# Patient Record
Sex: Female | Born: 1985 | Race: White | Hispanic: No | Marital: Married | Smoking: Never smoker
Health system: Southern US, Community
[De-identification: ages and names within clinical notes are randomized; demographics above are authoritative.]

## PROBLEM LIST (undated history)

## (undated) DIAGNOSIS — J45909 Unspecified asthma, uncomplicated: Secondary | ICD-10-CM

## (undated) DIAGNOSIS — T7840XA Allergy, unspecified, initial encounter: Secondary | ICD-10-CM

## (undated) DIAGNOSIS — C8190 Hodgkin lymphoma, unspecified, unspecified site: Secondary | ICD-10-CM

## (undated) HISTORY — DX: Allergy, unspecified, initial encounter: T78.40XA

## (undated) HISTORY — DX: Hodgkin lymphoma, unspecified, unspecified site: C81.90

## (undated) HISTORY — DX: Unspecified asthma, uncomplicated: J45.909

---
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
## 2007-07-25 HISTORY — PX: PORTA CATH INSERTION: CATH118285

## 2007-07-25 HISTORY — PX: OTHER SURGICAL HISTORY: SHX169

## 2008-07-24 HISTORY — PX: PORT-A-CATH REMOVAL: SHX5289

## 2017-01-02 ENCOUNTER — Encounter: Payer: Self-pay | Admitting: Hematology

## 2017-01-02 ENCOUNTER — Telehealth: Payer: Self-pay | Admitting: Hematology

## 2017-01-02 NOTE — Telephone Encounter (Signed)
Appt has been scheduled for the pt to see Dr. Irene Limbo on 7/19 at 11am. Pt aware to arrive 30 minutes early. Pt address and insurance has been verified. Letter mailed to the pt and faxed to the referring.

## 2017-01-12 ENCOUNTER — Telehealth: Payer: Self-pay | Admitting: Hematology

## 2017-01-12 NOTE — Telephone Encounter (Signed)
Returned call re rescheduling appointment.  Moved from 7/19 to 7/25 @ 3 pm to arrive 2:30 pm. Patient has new date/time.

## 2017-02-08 ENCOUNTER — Encounter: Payer: Commercial Managed Care - PPO | Admitting: Hematology

## 2017-02-14 ENCOUNTER — Telehealth: Payer: Self-pay | Admitting: Hematology

## 2017-02-14 ENCOUNTER — Ambulatory Visit (HOSPITAL_BASED_OUTPATIENT_CLINIC_OR_DEPARTMENT_OTHER): Payer: Commercial Managed Care - PPO | Admitting: Hematology

## 2017-02-14 ENCOUNTER — Encounter: Payer: Self-pay | Admitting: Hematology

## 2017-02-14 VITALS — BP 122/72 | HR 91 | Temp 98.4°F | Resp 20 | Ht 71.0 in | Wt 155.7 lb

## 2017-02-14 DIAGNOSIS — C8110 Nodular sclerosis classical Hodgkin lymphoma, unspecified site: Secondary | ICD-10-CM

## 2017-02-14 DIAGNOSIS — Z8571 Personal history of Hodgkin lymphoma: Secondary | ICD-10-CM

## 2017-02-14 DIAGNOSIS — Z803 Family history of malignant neoplasm of breast: Secondary | ICD-10-CM | POA: Diagnosis not present

## 2017-02-14 DIAGNOSIS — J45990 Exercise induced bronchospasm: Secondary | ICD-10-CM | POA: Diagnosis not present

## 2017-02-14 NOTE — Progress Notes (Signed)
Marland Kitchen    HEMATOLOGY/ONCOLOGY CONSULTATION NOTE  Date of Service: 02/14/2017  PCP: Starlyn Skeans PA-C  CHIEF COMPLAINTS/PURPOSE OF CONSULTATION:  Transfer of care for Hodgkins Lymphoma  HISTORY OF PRESENTING ILLNESS:  Sydney Cannon is a wonderful 31 y.o. female who has been referred to [place] by Starlyn Skeans PA-C for evaluation and continued management of Hodgkins Lymphoma.  Patient was previously treated for her Hodgkin's lymphoma at [facility] in [place]. 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 She had presented with neck lymphadenopathy with no constitutional symptoms. PET/CT scan done on 05/01/2008 as per report showed bulky lymphadenopathy within the bilateral inferior neck and supraclavicular areas and an abnormality along the liver which was noted to be small. She was staged as stage IIIA. She completed treatment with ABVD 6 cycles from 05/20/2008 through 10/19/2008. Patient did not receive any radiation. Patient was noted to have complete remission. She has remained in complete remission since March 2010 for more than 8 years.  Currently the patient notes no significant new lymphadenopathy. No fevers no chills no night sweats. No new fatigue. No chest pain or shortness of breath. No abdominal pain or distention. No residual toxicities from her prior chemotherapy.   Patient did conceive 2 years ago and has a 44-year-old child. She and her husband are currently trying to get pregnant. She is on Femara and progesterone to help with trying to get pregnant again.   MEDICAL HISTORY:  Past Medical History:  Diagnosis Date  . Allergy    Seasonal allergies  . Asthma   . Hodgkin's lymphoma ([place])   History of right breast mass unchanged in the past several years patient notes this was noted to be fibrocystic change. Multiple allergies including decreased grasses and cats was previously on desensitization  therapy. Mitral valve prolapse Chronic low back pain History of stomach ulcer in 1997 Exercise-induced asthma   SURGICAL HISTORY: Past Surgical History:  Procedure Laterality Date  . biopsy Right 2009   lymphnode on neck  . PORT-A-CATH REMOVAL Left 2010  . PORTA CATH INSERTION Left 2009  . WISDOM TOOTH EXTRACTION  2006    SOCIAL HISTORY: Social History   Social History  . Marital status: Unknown    Spouse name: N/A  . Number of children: N/A  . Years of education: N/A   Occupational History  . Not on file.   Social History Main Topics  . Smoking status: Never Smoker  . Smokeless tobacco: Never Used  . Alcohol use Yes     Comment: Occasional  . Drug use: No  . Sexual activity: Yes     Comment: Trying to get pregnant   Other Topics Concern  . Not on file   Social History Narrative  . 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   FAMILY HISTORY: Family History  Problem Relation Age of Onset  . Fibromyalgia Mother   . Cancer Maternal Aunt        Breast - cause of death  . Cancer Maternal Grandfather        Lingual   . Stroke Paternal Grandfather   Maternal aunt had breast cancer in her early 68s Maternal grandfather tongue cancer. Was a smoker.  ALLERGIES:  has no allergies on file.  MEDICATIONS:  Current Outpatient Prescriptions  Medication Sig Dispense Refill  .  cetirizine (ZYRTEC) 10 MG tablet Take 10 mg by mouth daily.    Marland Kitchen letrozole (FEMARA) 2.5 MG tablet Take 2.5 mg by mouth daily. Take 5mg  days 3-7 of cycle.    . Prenatal Vit-Fe Fumarate-FA (PRENATAL MULTIVITAMIN) TABS tablet Take 1 tablet by mouth daily at 12 noon.    . progesterone (PROMETRIUM) 100 MG capsule Take 100 mg by mouth daily. Take days 15-30 of cycle.     No current facility-administered medications for this visit.     REVIEW OF SYSTEMS:    10  Point review of Systems was done is negative except as noted above.  PHYSICAL EXAMINATION: ECOG PERFORMANCE STATUS: 0 - Asymptomatic  . Vitals:   02/14/17 1448  BP: 122/72  Pulse: 91  Resp: 20  Temp: 98.4 F (36.9 C)   Filed Weights   02/14/17 1448  Weight: 155 lb 11.2 oz (70.6 kg)   .Body mass index is 21.72 kg/m.  GENERAL:alert, in no acute distress and comfortable SKIN: no acute rashes, no significant lesions EYES: conjunctiva are pink and non-injected, sclera anicteric OROPHARYNX: MMM, no exudates, no oropharyngeal erythema or ulceration NECK: supple, no JVD LYMPH:  no palpable lymphadenopathy in the cervical, axillary or inguinal regions LUNGS: clear to auscultation b/l with normal respiratory effort HEART: regular rate & rhythm ABDOMEN:  normoactive bowel sounds , non tender, not distended. Extremity: no pedal edema PSYCH: alert & oriented x 3 with fluent speech NEURO: no focal motor/sensory deficits  LABORATORY DATA:  I have reviewed the data as listed  .No flowsheet data found.  .No flowsheet data found.   RADIOGRAPHIC STUDIES: I have personally reviewed the radiological images as listed and agreed with the findings in the report. No results found.  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 She had presented with neck lymphadenopathy with no constitutional symptoms. PET/CT scan done on 05/01/2008 as per report showed bulky lymphadenopathy within the bilateral inferior neck and supraclavicular areas and an abnormality along the liver which was noted to be small. She was staged as stage IIIA. She completed treatment with ABVD 6 cycles from 05/20/2008 through 10/19/2008. Patient did not receive any radiation. Patient was noted to have complete  remission. She has remained in complete remission since March 2010 for more than 8 years.  Plan -Outside records from [place] and primary care physician reviewed with the patient in details. -We discussed in detail NCCN survivorship and followup guidelines. -Patient currently has no clinical symptoms or physical findings suggestive of Hodgkin's lymphoma recurrence at this time. -No overt evidence of residual prohibitive toxicities from her previous chemotherapy. -No new chest pain or shortness of breath or suggestive cardiomyopathy or lung changes. -She has some chronic exercise-induced asthma which is very mild. -We discussed the options of getting repeat PFTs which the patient wants to hold off on at this time. -Baseline labs including CBC with differential and reticulocyte count, CMP and sedimentation rate have been ordered and are currently pending. -No indication for repeat imaging at this time in the absence of new symptoms and as that are any new concerning lab findings. -Repeat echo if symptomatic . Consideration of stress echo 10 years out from treatment.  #2 family history of early breast cancer in her maternal aunt in her early 41s.  Unclear if any genetic testing  was done. No family history of ovarian cancer, colon cancer, melanomas, prostate cancer or other cancers. Plan -I discussed and offered referral to the genetic counselor to discuss possible genetic testing for familial breast cancer. -Patient wants to hold off on genetic testing at this time.  #3 .Assisted reproductive techniques - on femara and progesterone at this time -- f/u with Gyn #4 Exercise induced asthma - mx per PCP  Labs tomorrow RTC with Dr Irene Limbo with labs in 12 months with labs. Earlier if any new concerns  All of the patients questions were answered with apparent satisfaction. The patient knows to call the clinic with any problems, questions or concerns.  I spent 45 minutes counseling the  patient face to face. The total time spent in the appointment was 60 minutes and more than 50% was on counseling and direct patient cares.    Sullivan Lone MD [place] AAHIVMS [facility] Hematology/Oncology Physician [facility]  (Office):       [phone] (Work cell):  [phone] (Fax):           [phone]  02/14/2017 3:39 PM

## 2017-02-14 NOTE — Telephone Encounter (Signed)
Patient cannot return to lab on tomorrow due to work schedule and scheduled lab for 8/3. Gave patient avs report and appointments for August 2018 and July 2019.

## 2017-02-23 ENCOUNTER — Other Ambulatory Visit: Payer: Commercial Managed Care - PPO

## 2017-02-23 ENCOUNTER — Ambulatory Visit (HOSPITAL_BASED_OUTPATIENT_CLINIC_OR_DEPARTMENT_OTHER): Payer: Commercial Managed Care - PPO

## 2017-02-23 DIAGNOSIS — Z8571 Personal history of Hodgkin lymphoma: Secondary | ICD-10-CM

## 2017-02-23 DIAGNOSIS — C8110 Nodular sclerosis classical Hodgkin lymphoma, unspecified site: Secondary | ICD-10-CM

## 2017-02-23 LAB — COMPREHENSIVE METABOLIC PANEL
ALBUMIN: 4.5 g/dL (ref 3.5–5.0)
ALT: 19 U/L (ref 0–55)
AST: 21 U/L (ref 5–34)
Alkaline Phosphatase: 18 U/L — ABNORMAL LOW (ref 40–150)
Anion Gap: 8 mEq/L (ref 3–11)
BUN: 12.6 mg/dL (ref 7.0–26.0)
CHLORIDE: 108 meq/L (ref 98–109)
CO2: 24 mEq/L (ref 22–29)
Calcium: 10 mg/dL (ref 8.4–10.4)
Creatinine: 0.9 mg/dL (ref 0.6–1.1)
EGFR: 86 mL/min/{1.73_m2} — ABNORMAL LOW (ref >90)
GLUCOSE: 85 mg/dL (ref 70–140)
POTASSIUM: 4.4 meq/L (ref 3.5–5.1)
SODIUM: 140 meq/L (ref 136–145)
Total Bilirubin: 0.8 mg/dL (ref 0.20–1.20)
Total Protein: 7.7 g/dL (ref 6.4–8.3)

## 2017-02-23 LAB — CBC & DIFF AND RETIC
BASO%: 0.7 % (ref 0.0–2.0)
BASOS ABS: 0.1 10*3/uL (ref 0.0–0.1)
EOS ABS: 0.2 10*3/uL (ref 0.0–0.5)
EOS%: 2.1 % (ref 0.0–7.0)
HCT: 39.1 % (ref 34.8–46.6)
HEMOGLOBIN: 12.7 g/dL (ref 11.6–15.9)
Immature Retic Fract: 0.2 % — ABNORMAL LOW (ref 1.60–10.00)
LYMPH#: 2.7 10*3/uL (ref 0.9–3.3)
LYMPH%: 38.4 % (ref 14.0–49.7)
MCH: 27.1 pg (ref 25.1–34.0)
MCHC: 32.5 g/dL (ref 31.5–36.0)
MCV: 83.4 fL (ref 79.5–101.0)
MONO#: 0.6 10*3/uL (ref 0.1–0.9)
MONO%: 8.6 % (ref 0.0–14.0)
NEUT#: 3.6 10*3/uL (ref 1.5–6.5)
NEUT%: 50.2 % (ref 38.4–76.8)
NRBC: 0 % (ref 0–0)
Platelets: 238 10*3/uL (ref 145–400)
RBC: 4.69 10*6/uL (ref 3.70–5.45)
RDW: 13.5 % (ref 11.2–14.5)
RETIC %: 1.05 % (ref 0.70–2.10)
Retic Ct Abs: 49.25 10*3/uL (ref 33.70–90.70)
WBC: 7.1 10*3/uL (ref 3.9–10.3)

## 2017-02-23 LAB — TSH: TSH: 1.538 m(IU)/L (ref 0.308–3.960)

## 2017-02-24 LAB — T3 UPTAKE
Free Thyroxine Index: 3.3 (ref 1.2–4.9)
T3 Uptake Ratio: 53 % — ABNORMAL HIGH (ref 24–39)

## 2017-02-24 LAB — SEDIMENTATION RATE: SED RATE: 2 mm/h (ref 0–32)

## 2017-02-24 LAB — T4: Thyroxine (T4): 6.2 ug/dL (ref 4.5–12.0)

## 2017-02-24 LAB — T4, FREE: FREE T4: 1.08 ng/dL (ref 0.82–1.77)

## 2017-05-21 ENCOUNTER — Other Ambulatory Visit (HOSPITAL_BASED_OUTPATIENT_CLINIC_OR_DEPARTMENT_OTHER): Payer: Self-pay | Admitting: Internal Medicine

## 2017-05-21 ENCOUNTER — Ambulatory Visit (HOSPITAL_BASED_OUTPATIENT_CLINIC_OR_DEPARTMENT_OTHER)
Admission: RE | Admit: 2017-05-21 | Discharge: 2017-05-21 | Disposition: A | Discharge status: Home / Self Care | Payer: Commercial Managed Care - PPO | Source: Ambulatory Visit | Attending: Internal Medicine | Admitting: Internal Medicine

## 2017-05-21 DIAGNOSIS — R591 Generalized enlarged lymph nodes: Secondary | ICD-10-CM | POA: Insufficient documentation

## 2017-05-25 ENCOUNTER — Other Ambulatory Visit: Payer: Self-pay | Admitting: Allergy

## 2017-05-25 ENCOUNTER — Ambulatory Visit
Admission: RE | Admit: 2017-05-25 | Discharge: 2017-05-25 | Disposition: A | Discharge status: Home / Self Care | Payer: Commercial Managed Care - PPO | Source: Ambulatory Visit | Attending: Allergy | Admitting: Allergy

## 2017-05-25 DIAGNOSIS — J4520 Mild intermittent asthma, uncomplicated: Secondary | ICD-10-CM

## 2017-06-29 MED FILL — OVIDREL 250 MCG/0.5 ML SYRG: 250 | 1 days supply | Qty: 1 | Fill #0

## 2018-01-08 LAB — OB RESULTS CONSOLE HIV ANTIBODY (ROUTINE TESTING): HIV: NONREACTIVE

## 2018-01-08 LAB — OB RESULTS CONSOLE GC/CHLAMYDIA
Chlamydia: NEGATIVE
Gonorrhea: NEGATIVE

## 2018-01-08 LAB — OB RESULTS CONSOLE RUBELLA ANTIBODY, IGM: Rubella: IMMUNE

## 2018-02-05 NOTE — Progress Notes (Signed)
Marland Kitchen    HEMATOLOGY/ONCOLOGY CLINIC NOTE  Date of Service: 02/13/2018  PCP: Starlyn Skeans PA-C  CHIEF COMPLAINTS/PURPOSE OF CONSULTATION:  Transfer of care for Hodgkins Lymphoma  HISTORY OF PRESENTING ILLNESS:  Sydney Cannon is a wonderful 32 y.o. female who has been referred to [place] by Starlyn Skeans PA-C for evaluation and continued management of Hodgkins Lymphoma.  Patient was previously treated for her Hodgkin's lymphoma at [facility] in [place]. 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 She had presented with neck lymphadenopathy with no constitutional symptoms. PET/CT scan done on 05/01/2008 as per report showed bulky lymphadenopathy within the bilateral inferior neck and supraclavicular areas and an abnormality along the liver which was noted to be small. She was staged as stage IIIA. She completed treatment with ABVD 6 cycles from 05/20/2008 through 10/19/2008. Patient did not receive any radiation. Patient was noted to have complete remission. She has remained in complete remission since March 2010 for more than 8 years.  Currently the patient notes no significant new lymphadenopathy. No fevers no chills no night sweats. No new fatigue. No chest pain or shortness of breath. No abdominal pain or distention. No residual toxicities from her prior chemotherapy.   Patient did conceive 2 years ago and has a 73-year-old child. She and her husband are currently trying to get pregnant. She is on Femara and progesterone to help with trying to get pregnant again.  Interval History:  Sydney Cannon returns today for management and evaluation of her Classical Hodgkin's Lymphoma. The patient's last visit with [place] was on 02/14/17. The pt reports that she is doing well overall.   The pt reports that she has become pregnant and is currently at [redacted] weeks gestation. She denies being SOB or palpitations. She has  been taking prenatal vitamins and has not taken any iron supplements. She is maintaining follow up with her OBGYN and has not had nausea thus far.   She notes that she has continued to monitor herself for enlarged lymph nodes. She notes stability in her right neck, slightly palpable, lymph node which was previously evaluated with an US Soft tissue neck on 05/22/17.   Lab results today (02/13/18) of CBC w/diff, CMP, and Reticulocytes is as follows: all values are WNL except for ANC at 6.8k, AST at 14, Alk Phos at 23. Sed rate 02/13/18 is WNL at 16  On review of systems, pt reports some fatigue that she associates with pregnancy, and denies SOB, palpitations, changes in breathing, abdominal pains, nausea, fevers, chills, night sweats, skin rashes, leg swelling, and any other symptoms.    MEDICAL HISTORY:  Past Medical History:  Diagnosis Date  . Allergy    Seasonal allergies  . Asthma   . Hodgkin's lymphoma ([place])   History of right breast mass unchanged in the past several years patient notes this was noted to be fibrocystic change. Multiple allergies including decreased grasses and cats was previously on desensitization therapy. Mitral valve prolapse Chronic low back pain History of stomach ulcer in 1997 Exercise-induced asthma   SURGICAL HISTORY: Past Surgical History:  Procedure Laterality Date  . biopsy Right 2009   lymphnode on neck  . PORT-A-CATH REMOVAL Left 2010  . PORTA CATH INSERTION Left 2009  . WISDOM TOOTH EXTRACTION  2006    SOCIAL HISTORY: Social History   Socioeconomic History  . Marital status: Married    Spouse name: Not on file  . Number of children: Not on  file  . Years of education: Not on file  . Highest education level: Not on file  Occupational History  . Not on file  Social Needs  . Financial resource strain: Not on file  . Food insecurity:    Worry: Not on file    Inability: Not on file  . Transportation needs:    Medical: Not on file     Non-medical: Not on file  Tobacco Use  . Smoking status: Never Smoker  . Smokeless tobacco: Never Used  Substance and Sexual Activity  . Alcohol use: Yes    Comment: Occasional  . Drug use: No  . Sexual activity: Yes    Comment: [redacted] weeks pregnant  Lifestyle  . Physical activity:    Days per week: Not on file    Minutes per session: Not on file  . Stress: Not on file  Relationships  . Social connections:    Talks on phone: Not on file    Gets together: Not on file    Attends religious service: Not on file    Active member of club or organization: Not on file    Attends meetings of clubs or organizations: Not on file    Relationship status: Not on file  . Intimate partner violence:    Fear of current or ex partner: Not on file    Emotionally abused: Not on file    Physically abused: Not on file    Forced sexual activity: Not on file  Other Topics Concern  . Not on file  Social History Narrative  . 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   FAMILY HISTORY: Family History  Problem Relation Age of Onset  . Fibromyalgia Mother   . Cancer Maternal Aunt        Breast - cause of death  . Cancer Maternal Grandfather        Lingual   . Stroke Paternal Grandfather   Maternal aunt had breast cancer in her early 28s Maternal grandfather tongue cancer. Was a smoker.  ALLERGIES:  has No Known Allergies.  MEDICATIONS:  Current Outpatient Medications  Medication Sig Dispense Refill  . levocetirizine (XYZAL) 5 MG tablet Take 5 mg by mouth daily.    . montelukast (SINGULAIR) 10 MG tablet Take 10 mg by mouth daily.    . Prenatal Vit-Fe Fumarate-FA (PRENATAL MULTIVITAMIN) TABS tablet Take 1 tablet by mouth daily at 12 noon.    . cetirizine (ZYRTEC) 10 MG tablet Take 10 mg by mouth daily.    Marland Kitchen letrozole (FEMARA) 2.5 MG tablet Take 2.5 mg by mouth daily.  Take 16m days 3-7 of cycle.    . progesterone (PROMETRIUM) 100 MG capsule Take 100 mg by mouth daily. Take days 15-30 of cycle.     No current facility-administered medications for this visit.     REVIEW OF SYSTEMS:    A 10+ POINT REVIEW OF SYSTEMS WAS OBTAINED including neurology, dermatology, psychiatry, cardiac, respiratory, lymph, extremities, GI, GU, Musculoskeletal, constitutional, breasts, reproductive, HEENT.  All pertinent positives are noted in the HPI.  All others are negative.   PHYSICAL EXAMINATION: ECOG PERFORMANCE STATUS: 0 - Asymptomatic  . Vitals:   02/13/18 1059  BP: 100/64  Pulse: 76  Resp: 18  Temp: 97.8 F (36.6 C)  SpO2: 100%   Filed Weights   02/13/18 1059  Weight: 166  lb 14.4 oz (75.7 kg)   .Body mass index is 23.28 kg/m.  GENERAL:alert, in no acute distress and comfortable SKIN: no acute rashes, no significant lesions EYES: conjunctiva are pink and non-injected, sclera anicteric OROPHARYNX: MMM, no exudates, no oropharyngeal erythema or ulceration NECK: supple, no JVD LYMPH:  no palpable lymphadenopathy in the cervical, axillary or inguinal regions LUNGS: clear to auscultation b/l with normal respiratory effort HEART: regular rate & rhythm ABDOMEN:  normoactive bowel sounds , non tender, not distended. No palpable hepatosplenomegaly.  Extremity: no pedal edema PSYCH: alert & oriented x 3 with fluent speech NEURO: no focal motor/sensory deficits   LABORATORY DATA:  I have reviewed the data as listed  . CBC Latest Ref Rng & Units 02/13/2018 02/23/2017  WBC 3.9 - 10.3 K/uL 9.8 7.1  Hemoglobin 11.6 - 15.9 g/dL 11.6 12.7  Hematocrit 34.8 - 46.6 % 35.2 39.1  Platelets 145 - 400 K/uL 237 238    . CMP Latest Ref Rng & Units 02/13/2018 02/23/2017  Glucose 70 - 99 mg/dL 75 85  BUN 6 - 20 mg/dL 9 12.6  Creatinine 0.44 - 1.00 mg/dL 0.72 0.9  Sodium 135 - 145 mmol/L 137 140  Potassium 3.5 - 5.1 mmol/L 4.1 4.4  Chloride 98 - 111 mmol/L 107 -  CO2 22  - 32 mmol/L 23 24  Calcium 8.9 - 10.3 mg/dL 9.1 10.0  Total Protein 6.5 - 8.1 g/dL 6.6 7.7  Total Bilirubin 0.3 - 1.2 mg/dL 0.6 0.80  Alkaline Phos 38 - 126 U/L 23(L) 18(L)  AST 15 - 41 U/L 14(L) 21  ALT 0 - 44 U/L 10 19     RADIOGRAPHIC STUDIES: I have personally reviewed the radiological images as listed and agreed with the findings in the report. No results found.  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 She had presented with neck lymphadenopathy with no constitutional symptoms. PET/CT scan done on 05/01/2008 as per report showed bulky lymphadenopathy within the bilateral inferior neck and supraclavicular areas and an abnormality along the liver which was noted to be small. She was staged as stage IIIA. She completed treatment with ABVD 6 cycles from 05/20/2008 through 10/19/2008. Patient did not receive any radiation. Patient was noted to have complete remission. She has remained in complete remission since March 2010 for more than 8 years.  PLAN:  -Discussed pt labwork today, 02/13/18; blood counts and chemistries are stable. Sed rate is normal, though this may increase through pregnancy  -Discussed that given her history of Adriamycin, if she develops palpitations or SOB, will collect an ECHO -Will continue to watch blood counts  -No overt evidence of her residual toxicities -The pt shows no clinical or lab progression of Hodgkin's lymphoma at this time.  -Will see the pt back in one year   #2 family history of early breast cancer in her maternal aunt in her early 66s.  Unclear if any genetic testing was done. No family history of ovarian cancer, colon cancer, melanomas, prostate cancer or other cancers. PLAN:  -I discussed and offered referral to the genetic  counselor to discuss possible genetic testing for familial breast cancer. -Patient wants to hold off on genetic testing at this time.  #3 .Assisted reproductive techniques - on femara and progesterone at this time -- f/u with  Gyn #4 Exercise induced asthma - mx per PCP  RTC with Dr Irene Limbo in 12 months with labs  All of the patients questions were answered with apparent satisfaction. The patient knows to call the clinic with any problems, questions or concerns.  The total time spent in the appt was 15 minutes and more than 50% was on counseling and direct patient cares.    Sullivan Lone MD [place] AAHIVMS [facility] [place] [facility] Hematology/Oncology Physician [facility]  (Office):       [phone] (Work cell):  [phone] (Fax):           [phone]  02/13/2018 11:44 AM  I, [place], am acting as a Education administrator for Dr Irene Limbo.   .I have reviewed the above documentation for accuracy and completeness, and I agree with the above. Brunetta Genera MD

## 2018-02-13 ENCOUNTER — Inpatient Hospital Stay: Payer: Commercial Managed Care - PPO

## 2018-02-13 ENCOUNTER — Inpatient Hospital Stay: Payer: Commercial Managed Care - PPO | Attending: Hematology | Admitting: Hematology

## 2018-02-13 ENCOUNTER — Telehealth: Payer: Self-pay | Admitting: Hematology

## 2018-02-13 ENCOUNTER — Encounter: Payer: Self-pay | Admitting: Hematology

## 2018-02-13 VITALS — BP 100/64 | HR 76 | Temp 97.8°F | Resp 18 | Ht 71.0 in | Wt 166.9 lb

## 2018-02-13 DIAGNOSIS — C8111 Nodular sclerosis classical Hodgkin lymphoma, lymph nodes of head, face, and neck: Secondary | ICD-10-CM | POA: Insufficient documentation

## 2018-02-13 DIAGNOSIS — Z803 Family history of malignant neoplasm of breast: Secondary | ICD-10-CM | POA: Diagnosis not present

## 2018-02-13 DIAGNOSIS — C8110 Nodular sclerosis classical Hodgkin lymphoma, unspecified site: Secondary | ICD-10-CM

## 2018-02-13 DIAGNOSIS — G8929 Other chronic pain: Secondary | ICD-10-CM | POA: Insufficient documentation

## 2018-02-13 DIAGNOSIS — Z3A16 16 weeks gestation of pregnancy: Secondary | ICD-10-CM | POA: Diagnosis not present

## 2018-02-13 DIAGNOSIS — Z9221 Personal history of antineoplastic chemotherapy: Secondary | ICD-10-CM | POA: Diagnosis not present

## 2018-02-13 LAB — CBC WITH DIFFERENTIAL (CANCER CENTER ONLY)
BASOS ABS: 0 10*3/uL (ref 0.0–0.1)
BASOS PCT: 0 %
Eosinophils Absolute: 0.1 10*3/uL (ref 0.0–0.5)
Eosinophils Relative: 1 %
HEMATOCRIT: 35.2 % (ref 34.8–46.6)
Hemoglobin: 11.6 g/dL (ref 11.6–15.9)
Lymphocytes Relative: 23 %
Lymphs Abs: 2.3 10*3/uL (ref 0.9–3.3)
MCH: 27.8 pg (ref 25.1–34.0)
MCHC: 33 g/dL (ref 31.5–36.0)
MCV: 84.2 fL (ref 79.5–101.0)
MONO ABS: 0.6 10*3/uL (ref 0.1–0.9)
Monocytes Relative: 6 %
NEUTROS ABS: 6.8 10*3/uL — AB (ref 1.5–6.5)
NEUTROS PCT: 70 %
Platelet Count: 237 10*3/uL (ref 145–400)
RBC: 4.18 MIL/uL (ref 3.70–5.45)
RDW: 13.6 % (ref 11.2–14.5)
WBC Count: 9.8 10*3/uL (ref 3.9–10.3)

## 2018-02-13 LAB — COMPREHENSIVE METABOLIC PANEL
ALT: 10 U/L (ref 0–44)
ANION GAP: 7 (ref 5–15)
AST: 14 U/L — ABNORMAL LOW (ref 15–41)
Albumin: 3.5 g/dL (ref 3.5–5.0)
Alkaline Phosphatase: 23 U/L — ABNORMAL LOW (ref 38–126)
BILIRUBIN TOTAL: 0.6 mg/dL (ref 0.3–1.2)
BUN: 9 mg/dL (ref 6–20)
CO2: 23 mmol/L (ref 22–32)
Calcium: 9.1 mg/dL (ref 8.9–10.3)
Chloride: 107 mmol/L (ref 98–111)
Creatinine, Ser: 0.72 mg/dL (ref 0.44–1.00)
GFR calc Af Amer: >60 mL/min (ref >60)
Glucose, Bld: 75 mg/dL (ref 70–99)
POTASSIUM: 4.1 mmol/L (ref 3.5–5.1)
Sodium: 137 mmol/L (ref 135–145)
TOTAL PROTEIN: 6.6 g/dL (ref 6.5–8.1)

## 2018-02-13 LAB — RETICULOCYTES
RBC.: 4.18 MIL/uL (ref 3.70–5.45)
Retic Count, Absolute: 58.5 10*3/uL (ref 33.7–90.7)
Retic Ct Pct: 1.4 % (ref 0.7–2.1)

## 2018-02-13 LAB — SEDIMENTATION RATE: SED RATE: 16 mm/h (ref 0–22)

## 2018-02-13 NOTE — Telephone Encounter (Signed)
Appointments scheduled AVS printed/ patient declined calendar per 7/24 los

## 2018-07-09 LAB — OB RESULTS CONSOLE GBS: GBS: NEGATIVE

## 2018-07-24 NOTE — L&D Delivery Note (Signed)
Operative Delivery Note- outlet vacuum and manual removal of placenta   FHT normal throughout the day. Complete and station +2+3. Pushed once, fetal bradycardia noted. Moved to left lateral and FHR recovered. Pushed again in left lateral, bradycardia noted and now a loop of cord noted in front of the head. Head at +4, it was elevated and cord pushed back up behind the head. Patient on her back now and pushed again, bradycardia returned, so outlet vacuum advised and she agreed.  Midline episiotomy made (prior hx).   Kiwi outlet vacuum application, one application, one pull and 2 pushes from mother delivered the head.  At 1:43 AM a viable and healthy female was delivered via Vaginal, Vacuum Neurosurgeon).  Presentation: vertex; Position: Occiput,, Anterior; Station: +4.  Verbal consent: obtained from patient.  Risks and benefits discussed in detail.  Risks include, but are not limited to the risks of anesthesia, bleeding, infection, damage to maternal tissues, fetal cephalhematoma.  There is also the risk of inability to effect vaginal delivery of the head, or shoulder dystocia that cannot be resolved by established maneuvers, leading to the need for emergency cesarean section.  APGAR: 7, 9; weight pending   .     Cord:  with the following complications: cord prolapse with head at +4, was reduced and delivery completed.  Cord pH: sent   Anesthesia: Epidural and 1% lidocaine local infiltration  Instruments: Kiwi vacuum  Episiotomy:  Midline, no extensions Lacerations:  Supraurethral /subclitoral, repaired with red rubber catheter in place as urethral guide, no urethral injury Suture Repair: 3.0 vicryl rapide Est. Blood Loss (mL): 1100 cc    Placenta retained: Manual removal in labor room Pitocin started after baby delivered. Placenta didn't separate after 30 minutes. Patient was counseled re manual removal of placenta, possible incomplete removal and need to have D&C in OR.  Anesthesia MD informed,  CRNA came in to assist with Nitroglycerine for cervical relaxation and manual removal of placenta.  3 sprays of sublingual nitroglycerine used, BP monitored and 300 cc LR bolus infused to prevent hypotension.   Placenta removed manually, one large portion and 2 smaller pieces. Uterus explored,no further pieces palpated and removal felt complete.  IM Methergine 0.2 mg given to treat atony from nitroglycerine and pitocin started again BP low in 90s/50s, but improved and patient remained stable.   Cefoxitin 2 gm IV q 6 hrs for 4 doses for prophylaxis for manual removal of placenta.   Mom to postpartum.  Baby to Couplet care / Skin to Skin.  Sydney Cannon 07/28/2018, 2:41 AM

## 2018-07-26 ENCOUNTER — Other Ambulatory Visit: Payer: Self-pay | Admitting: Obstetrics & Gynecology

## 2018-07-26 DIAGNOSIS — O09813 Supervision of pregnancy resulting from assisted reproductive technology, third trimester: Secondary | ICD-10-CM | POA: Diagnosis not present

## 2018-07-26 DIAGNOSIS — Z3A39 39 weeks gestation of pregnancy: Secondary | ICD-10-CM | POA: Diagnosis not present

## 2018-07-27 ENCOUNTER — Inpatient Hospital Stay (HOSPITAL_COMMUNITY): Payer: Commercial Managed Care - PPO | Admitting: Anesthesiology

## 2018-07-27 ENCOUNTER — Other Ambulatory Visit: Payer: Self-pay

## 2018-07-27 ENCOUNTER — Encounter (HOSPITAL_COMMUNITY): Payer: Self-pay | Admitting: *Deleted

## 2018-07-27 ENCOUNTER — Inpatient Hospital Stay (HOSPITAL_COMMUNITY)
Admission: RE | Admit: 2018-07-27 | Discharge: 2018-07-29 | DRG: 807 | Disposition: A | Discharge status: Home / Self Care | Payer: Commercial Managed Care - PPO | Attending: Obstetrics & Gynecology | Admitting: Obstetrics & Gynecology

## 2018-07-27 DIAGNOSIS — Z3A39 39 weeks gestation of pregnancy: Secondary | ICD-10-CM

## 2018-07-27 DIAGNOSIS — O9081 Anemia of the puerperium: Secondary | ICD-10-CM | POA: Diagnosis not present

## 2018-07-27 DIAGNOSIS — O4103X0 Oligohydramnios, third trimester, not applicable or unspecified: Secondary | ICD-10-CM | POA: Diagnosis not present

## 2018-07-27 DIAGNOSIS — O288 Other abnormal findings on antenatal screening of mother: Secondary | ICD-10-CM | POA: Diagnosis present

## 2018-07-27 LAB — CBC
HEMATOCRIT: 34.2 % — AB (ref 36.0–46.0)
Hemoglobin: 10.7 g/dL — ABNORMAL LOW (ref 12.0–15.0)
MCH: 26.9 pg (ref 26.0–34.0)
MCHC: 31.3 g/dL (ref 30.0–36.0)
MCV: 85.9 fL (ref 80.0–100.0)
Platelets: 273 10*3/uL (ref 150–400)
RBC: 3.98 MIL/uL (ref 3.87–5.11)
RDW: 14.1 % (ref 11.5–15.5)
WBC: 10.9 10*3/uL — ABNORMAL HIGH (ref 4.0–10.5)
nRBC: 0 % (ref 0.0–0.2)

## 2018-07-27 LAB — ABO/RH: ABO/RH(D): O POS

## 2018-07-27 LAB — TYPE AND SCREEN
ABO/RH(D): O POS
Antibody Screen: NEGATIVE

## 2018-07-27 LAB — RPR: RPR Ser Ql: NONREACTIVE

## 2018-07-27 MED ORDER — PHENYLEPHRINE 40 MCG/ML (10ML) SYRINGE FOR IV PUSH (FOR BLOOD PRESSURE SUPPORT)
80.0000 ug | PREFILLED_SYRINGE | INTRAVENOUS | Status: DC | PRN
  Filled 2018-07-27 (×2): qty 10

## 2018-07-27 MED ORDER — EPHEDRINE 5 MG/ML INJ
10.0000 mg | INTRAVENOUS | Status: DC | PRN
  Filled 2018-07-27: qty 2

## 2018-07-27 MED ORDER — OXYTOCIN 40 UNITS IN LACTATED RINGERS INFUSION - SIMPLE MED
2.5000 [IU]/h | INTRAVENOUS | Status: DC
  Administered 2018-07-28: 2.5 [IU]/h via INTRAVENOUS
  Filled 2018-07-27: qty 1000

## 2018-07-27 MED ORDER — PHENYLEPHRINE 40 MCG/ML (10ML) SYRINGE FOR IV PUSH (FOR BLOOD PRESSURE SUPPORT)
80.0000 ug | PREFILLED_SYRINGE | INTRAVENOUS | Status: AC | PRN
  Administered 2018-07-28 (×2): 20 ug via INTRAVENOUS
  Administered 2018-07-28: 40 ug via INTRAVENOUS

## 2018-07-27 MED ORDER — SOD CITRATE-CITRIC ACID 500-334 MG/5ML PO SOLN
30.0000 mL | ORAL | Status: DC | PRN
  Administered 2018-07-27: 30 mL via ORAL
  Filled 2018-07-27: qty 15

## 2018-07-27 MED ORDER — OXYTOCIN BOLUS FROM INFUSION
500.0000 mL | Freq: Once | INTRAVENOUS | Status: AC
  Administered 2018-07-28: 500 mL via INTRAVENOUS

## 2018-07-27 MED ORDER — LACTATED RINGERS IV SOLN
INTRAVENOUS | Status: DC
  Administered 2018-07-27 (×3): via INTRAVENOUS

## 2018-07-27 MED ORDER — LACTATED RINGERS IV SOLN
500.0000 mL | INTRAVENOUS | Status: DC | PRN
  Administered 2018-07-28: 500 mL via INTRAVENOUS

## 2018-07-27 MED ORDER — LIDOCAINE HCL (PF) 1 % IJ SOLN
INTRAMUSCULAR | Status: DC | PRN
  Administered 2018-07-27: 13 mL via EPIDURAL

## 2018-07-27 MED ORDER — LIDOCAINE HCL (PF) 1 % IJ SOLN
30.0000 mL | INTRAMUSCULAR | Status: DC | PRN
  Administered 2018-07-28: 30 mL via SUBCUTANEOUS
  Filled 2018-07-27: qty 30

## 2018-07-27 MED ORDER — ACETAMINOPHEN 325 MG PO TABS: 650.0000 mg | ORAL_TABLET | ORAL | Status: DC | PRN

## 2018-07-27 MED ORDER — TERBUTALINE SULFATE 1 MG/ML IJ SOLN
0.2500 mg | Freq: Once | INTRAMUSCULAR | Status: DC | PRN
  Filled 2018-07-27: qty 1

## 2018-07-27 MED ORDER — ONDANSETRON HCL 4 MG/2ML IJ SOLN
4.0000 mg | Freq: Four times a day (QID) | INTRAMUSCULAR | Status: DC | PRN
  Administered 2018-07-27: 4 mg via INTRAVENOUS
  Filled 2018-07-27: qty 2

## 2018-07-27 MED ORDER — OXYTOCIN 40 UNITS IN LACTATED RINGERS INFUSION - SIMPLE MED
1.0000 m[IU]/min | INTRAVENOUS | Status: DC
  Administered 2018-07-27: 2 m[IU]/min via INTRAVENOUS
  Filled 2018-07-27: qty 1000

## 2018-07-27 MED ORDER — LACTATED RINGERS IV SOLN
500.0000 mL | Freq: Once | INTRAVENOUS | Status: AC
  Administered 2018-07-27: 500 mL via INTRAVENOUS

## 2018-07-27 MED ORDER — DIPHENHYDRAMINE HCL 50 MG/ML IJ SOLN: 12.5000 mg | INTRAMUSCULAR | Status: DC | PRN

## 2018-07-27 MED ORDER — FENTANYL 2.5 MCG/ML BUPIVACAINE 1/10 % EPIDURAL INFUSION (WH - ANES)
14.0000 mL/h | INTRAMUSCULAR | Status: DC | PRN
  Administered 2018-07-27: 14 mL/h via EPIDURAL
  Filled 2018-07-27: qty 100

## 2018-07-27 NOTE — H&P (Signed)
Sydney Cannon is a 33 y.o. female presenting at 39.2 wks for IOL for oligohydramnios. IVF pregnancy. Multiple URIs in pregnancy. Growth AGA, sono on 1/3 noted oligohydramnios at 6 cm, so IOL advised.   H/o Oligohydramnios in 1st preg. SVD at term after IOL for oligo, baby had SVT in utero, mother took digoxin in 3rd trim. Baby was in NICU for obs.   MedHx- Hodgkin;s lymphoma, recovered  OB History    Gravida  2   Para  1   Term  1   Preterm  0   AB  0   Living  0     SAB  0   TAB  0   Ectopic  0   Multiple  0   Live Births  0          Past Medical History:  Diagnosis Date  . Allergy    Seasonal allergies  . Asthma    mild   . Hodgkin's lymphoma ([facility])    2009   Past Surgical History:  Procedure Laterality Date  . biopsy Right 2009   lymphnode on neck  . PORT-A-CATH REMOVAL Left 2010  . PORTA CATH INSERTION Left 2009  . WISDOM TOOTH EXTRACTION  2006   Family History: family history includes Cancer in her maternal aunt and maternal grandfather; Fibromyalgia in her mother; Stroke in her paternal grandfather. Social History:  reports that she has never smoked. She has never used smokeless tobacco. She reports current alcohol use. She reports that she does not use drugs.     Maternal Diabetes: No Genetic Screening: Normal Maternal Ultrasounds/Referrals: Normal Fetal Ultrasounds or other Referrals:  None Maternal Substance Abuse:  No Significant Maternal Medications:  None Significant Maternal Lab Results:  Lab values include: Group B Strep negative Other Comments:  None  ROS History Dilation: 2 Effacement (%): 50 Station: -2 Exam by:: Alvy Beal RN  Blood pressure 128/84, pulse 75, temperature 98.2 F (36.8 C), temperature source Oral, resp. rate 18, height 5\' 11"  (1.803 m), weight 89.6 kg. Exam Physical Exam  Physical exam:  A&O x 3, no acute distress. Pleasant HEENT neg, no thyromegaly Lungs CTA bilat CV RRR, S1S2 normal Abdo soft,  non tender, non acute Extr no edema/ tenderness Pelvic above FHT  130s + accels no decels mod variability- cat I Toco irreg   Prenatal labs: ABO, Rh: --/--/O POS (01/04 7654) Antibody: NEG (01/04 0834) Rubella:  Imm RPR:   NR HBsAg:   Neg HIV:   Neg GBS:   Neg    Assessment/Plan: G2P1, 39.2 wks, IVF preg, IOL for low AFI at 6cm with h/o oligohydramnios in 1st pregnancy. EFW 7.1/2 lbs. Anticipate SVD.  GBS(-) Epidural/ pain mngmt options Pitocin .   Elveria Royals 07/27/2018, 12:35 PM

## 2018-07-27 NOTE — Anesthesia Procedure Notes (Signed)
Epidural Patient location during procedure: OB Start time: 07/27/2018 8:32 PM End time: 07/27/2018 8:46 PM  Staffing Anesthesiologist: Lynda Rainwater, MD Performed: anesthesiologist   Preanesthetic Checklist Completed: patient identified, site marked, surgical consent, pre-op evaluation, timeout performed, IV checked, risks and benefits discussed and monitors and equipment checked  Epidural Patient position: sitting Prep: ChloraPrep Patient monitoring: heart rate, cardiac monitor, continuous pulse ox and blood pressure Approach: midline Location: L2-L3 Injection technique: LOR saline  Needle:  Needle type: Tuohy  Needle gauge: 17 G Needle length: 9 cm Needle insertion depth: 5 cm Catheter type: closed end flexible Catheter size: 20 Guage Catheter at skin depth: 9 cm Test dose: negative  Assessment Events: blood not aspirated, injection not painful, no injection resistance, negative IV test and no paresthesia  Additional Notes Reason for block:procedure for pain

## 2018-07-27 NOTE — Anesthesia Preprocedure Evaluation (Signed)
Anesthesia Evaluation  Patient identified by MRN, date of birth, ID band Patient awake    Reviewed: Allergy & Precautions, NPO status , Patient's Chart, lab work & pertinent test results  Airway Mallampati: II  TM Distance: >3 FB Neck ROM: Full    Dental no notable dental hx.    Pulmonary asthma ,    Pulmonary exam normal breath sounds clear to auscultation       Cardiovascular negative cardio ROS Normal cardiovascular exam Rhythm:Regular Rate:Normal     Neuro/Psych negative neurological ROS  negative psych ROS   GI/Hepatic negative GI ROS, Neg liver ROS,   Endo/Other  negative endocrine ROS  Renal/GU negative Renal ROS  negative genitourinary   Musculoskeletal negative musculoskeletal ROS (+)   Abdominal   Peds negative pediatric ROS (+)  Hematology negative hematology ROS (+)   Anesthesia Other Findings   Reproductive/Obstetrics (+) Pregnancy                             Anesthesia Physical Anesthesia Plan  ASA: II  Anesthesia Plan: Epidural   Post-op Pain Management:    Induction:   PONV Risk Score and Plan:   Airway Management Planned:   Additional Equipment:   Intra-op Plan:   Post-operative Plan:   Informed Consent:   Plan Discussed with:   Anesthesia Plan Comments:         Anesthesia Quick Evaluation  

## 2018-07-27 NOTE — Anesthesia Pain Management Evaluation Note (Signed)
  CRNA Pain Management Visit Note  Patient: Sydney Cannon, 33 y.o., female  "Hello I am a member of the anesthesia team at [facility]. We have an anesthesia team available at all times to provide care throughout the hospital, including epidural management and anesthesia for C-section. I don't know your plan for the delivery whether it a natural birth, water birth, IV sedation, nitrous supplementation, doula or epidural, but we want to meet your pain goals."   1.Was your pain managed to your expectations on prior hospitalizations?   Yes   2.What is your expectation for pain management during this hospitalization?     Epidural  3.How can we help you reach that goal? Epidural in place  Record the patient's initial score and the patient's pain goal.   Pain: 0  Pain Goal: 3 [facility] wants you to be able to say your pain was always managed very well.  Charita Lindenberger 07/27/2018

## 2018-07-27 NOTE — Progress Notes (Signed)
Sydney Cannon is a 33 y.o. G2P1000 at [redacted]w[redacted]d IOL for oligohydramnios  Subjective: Some UCs are painful  Objective: BP 123/81   Pulse 79   Temp 98.2 F (36.8 C) (Oral)   Resp 16   Ht 5\' 11"  (1.803 m)   Wt 89.6 kg   BMI 27.55 kg/m  No intake/output data recorded. No intake/output data recorded.  FHT:  FHR: 140s bpm, variability: moderate,  accelerations:  Present,  decelerations:  Absent UC:   regular, every 2-4 minutes SVE:   Dilation: 2 Effacement (%): 50 Station: -1 Exam by:: Yanis Larin AROM, small amount of clear fluid   Labs: Lab Results  Component Value Date   WBC 10.9 (H) 07/27/2018   HGB 10.7 (L) 07/27/2018   HCT 34.2 (L) 07/27/2018   MCV 85.9 07/27/2018   PLT 273 07/27/2018    Assessment / Plan: Induction of labor due to low AFI,  progressing well on pitocin  Labor: early labor  Fetal Wellbeing:  Category I Pain Control:  options reviewed, epidural when desired in active labor 4 cm I/D:  GBS neg Anticipated MOD:  NSVD  Elveria Royals 07/27/2018

## 2018-07-28 ENCOUNTER — Encounter (HOSPITAL_COMMUNITY): Payer: Self-pay

## 2018-07-28 LAB — CBC
HCT: 29.7 % — ABNORMAL LOW (ref 36.0–46.0)
Hemoglobin: 9.4 g/dL — ABNORMAL LOW (ref 12.0–15.0)
MCH: 27 pg (ref 26.0–34.0)
MCHC: 31.6 g/dL (ref 30.0–36.0)
MCV: 85.3 fL (ref 80.0–100.0)
Platelets: 270 10*3/uL (ref 150–400)
RBC: 3.48 MIL/uL — ABNORMAL LOW (ref 3.87–5.11)
RDW: 14.2 % (ref 11.5–15.5)
WBC: 20.2 10*3/uL — ABNORMAL HIGH (ref 4.0–10.5)
nRBC: 0 % (ref 0.0–0.2)

## 2018-07-28 MED ORDER — SENNOSIDES-DOCUSATE SODIUM 8.6-50 MG PO TABS
2.0000 | ORAL_TABLET | ORAL | Status: DC
  Administered 2018-07-28: 2 via ORAL
  Filled 2018-07-28: qty 2

## 2018-07-28 MED ORDER — PANTOPRAZOLE SODIUM 20 MG PO TBEC
20.0000 mg | DELAYED_RELEASE_TABLET | Freq: Every day | ORAL | Status: DC
  Administered 2018-07-28 – 2018-07-29 (×2): 20 mg via ORAL
  Filled 2018-07-28 (×2): qty 1

## 2018-07-28 MED ORDER — DIPHENHYDRAMINE HCL 25 MG PO CAPS: 25.0000 mg | ORAL_CAPSULE | Freq: Four times a day (QID) | ORAL | Status: DC | PRN

## 2018-07-28 MED ORDER — NITROGLYCERIN 0.4 MG/SPRAY TL SOLN
Status: AC
  Filled 2018-07-28: qty 4.9

## 2018-07-28 MED ORDER — ZOLPIDEM TARTRATE 5 MG PO TABS: 5.0000 mg | ORAL_TABLET | Freq: Every evening | ORAL | Status: DC | PRN

## 2018-07-28 MED ORDER — IBUPROFEN 600 MG PO TABS
600.0000 mg | ORAL_TABLET | Freq: Four times a day (QID) | ORAL | Status: DC
  Administered 2018-07-28 – 2018-07-29 (×5): 600 mg via ORAL
  Filled 2018-07-28 (×6): qty 1

## 2018-07-28 MED ORDER — ONDANSETRON HCL 4 MG PO TABS: 4.0000 mg | ORAL_TABLET | ORAL | Status: DC | PRN

## 2018-07-28 MED ORDER — MONTELUKAST SODIUM 10 MG PO TABS
10.0000 mg | ORAL_TABLET | Freq: Every day | ORAL | Status: DC
  Administered 2018-07-28: 10 mg via ORAL
  Filled 2018-07-28: qty 1

## 2018-07-28 MED ORDER — LORATADINE 10 MG PO TABS
10.0000 mg | ORAL_TABLET | Freq: Every evening | ORAL | Status: DC
  Administered 2018-07-28: 10 mg via ORAL
  Filled 2018-07-28: qty 1

## 2018-07-28 MED ORDER — METHYLERGONOVINE MALEATE 0.2 MG/ML IJ SOLN
INTRAMUSCULAR | Status: AC
  Administered 2018-07-28: 0.2 mg
  Filled 2018-07-28: qty 1

## 2018-07-28 MED ORDER — BENZOCAINE-MENTHOL 20-0.5 % EX AERO
1.0000 "application " | INHALATION_SPRAY | CUTANEOUS | Status: DC | PRN
  Administered 2018-07-28: 1 via TOPICAL
  Filled 2018-07-28: qty 56

## 2018-07-28 MED ORDER — DIBUCAINE 1 % RE OINT: 1.0000 "application " | TOPICAL_OINTMENT | RECTAL | Status: DC | PRN

## 2018-07-28 MED ORDER — SODIUM CHLORIDE 0.9 % IV SOLN
2.0000 g | Freq: Four times a day (QID) | INTRAVENOUS | Status: AC
  Administered 2018-07-28 (×3): 2 g via INTRAVENOUS
  Filled 2018-07-28 (×3): qty 2

## 2018-07-28 MED ORDER — PHENYLEPHRINE 40 MCG/ML (10ML) SYRINGE FOR IV PUSH (FOR BLOOD PRESSURE SUPPORT)
PREFILLED_SYRINGE | INTRAVENOUS | Status: AC
  Filled 2018-07-28: qty 10

## 2018-07-28 MED ORDER — ACETAMINOPHEN 325 MG PO TABS: 650.0000 mg | ORAL_TABLET | ORAL | Status: DC | PRN

## 2018-07-28 MED ORDER — SIMETHICONE 80 MG PO CHEW: 80.0000 mg | CHEWABLE_TABLET | ORAL | Status: DC | PRN

## 2018-07-28 MED ORDER — METHYLERGONOVINE MALEATE 0.2 MG/ML IJ SOLN
0.2000 mg | Freq: Once | INTRAMUSCULAR | Status: AC
  Administered 2018-07-28: 0.2 mg via INTRAMUSCULAR

## 2018-07-28 MED ORDER — COCONUT OIL OIL: 1.0000 "application " | TOPICAL_OIL | Status: DC | PRN

## 2018-07-28 MED ORDER — WITCH HAZEL-GLYCERIN EX PADS: 1.0000 "application " | MEDICATED_PAD | CUTANEOUS | Status: DC | PRN

## 2018-07-28 MED ORDER — TETANUS-DIPHTH-ACELL PERTUSSIS 5-2.5-18.5 LF-MCG/0.5 IM SUSP: 0.5000 mL | Freq: Once | INTRAMUSCULAR | Status: DC

## 2018-07-28 MED ORDER — NITROGLYCERIN 0.4 MG/SPRAY TL SOLN
1.0000 | Status: DC
  Administered 2018-07-28: 3 via SUBLINGUAL
  Filled 2018-07-28: qty 4.9

## 2018-07-28 MED ORDER — FERROUS SULFATE 325 (65 FE) MG PO TABS
325.0000 mg | ORAL_TABLET | Freq: Two times a day (BID) | ORAL | Status: DC
  Administered 2018-07-28 – 2018-07-29 (×3): 325 mg via ORAL
  Filled 2018-07-28 (×3): qty 1

## 2018-07-28 MED ORDER — ONDANSETRON HCL 4 MG/2ML IJ SOLN: 4.0000 mg | INTRAMUSCULAR | Status: DC | PRN

## 2018-07-28 MED ORDER — PRENATAL MULTIVITAMIN CH
1.0000 | ORAL_TABLET | Freq: Every day | ORAL | Status: DC
  Filled 2018-07-28 (×2): qty 1

## 2018-07-28 MED ORDER — ALBUTEROL SULFATE (2.5 MG/3ML) 0.083% IN NEBU: 3.0000 mL | INHALATION_SOLUTION | Freq: Four times a day (QID) | RESPIRATORY_TRACT | Status: DC | PRN

## 2018-07-28 NOTE — Progress Notes (Signed)
Post Partum Day 0, VAVD for fetal bradycardia, cord prolapse when began pushing  Subjective: no complaints, up ad lib, voiding, tolerating PO and + flatus  Objective: Blood pressure 112/76, pulse 75, temperature 97.8 F (36.6 C), temperature source Oral, resp. rate 20, height 5\' 11"  (1.803 m), weight 89.6 kg, SpO2 96 %, unknown if currently breastfeeding.  Physical Exam:  General: alert and cooperative Lochia: appropriate Uterine Fundus: firm Incision: healing well DVT Evaluation: No evidence of DVT seen on physical exam.  CBC Latest Ref Rng & Units 07/28/2018 07/27/2018   WBC 4.0 - 10.5 K/uL 20.2(H) 10.9(H)   Hemoglobin 12.0 - 15.0 g/dL 9.4(L) 10.7(L)   Hematocrit 36.0 - 46.0 % 29.7(L) 34.2(L)   Platelets 150 - 400 K/uL 270 273     Assessment/Plan: 33 yo G2P2, VAVD, Girl. Episiotomy and supraurethral lac.  Routine PP care. Breast feeding Iron for chronic anemia and worse PP.     LOS: 1 day   Elveria Royals 07/28/2018, 8:57 AM

## 2018-07-28 NOTE — Progress Notes (Signed)
LYGIA OLAES is a 33 y.o. G2P1000 at [redacted]w[redacted]d IOL for oligohydramnios  Subjective: Epidural, comfortable, resting  Objective: BP (!) 121/91   Pulse (!) 123   Temp 98.6 F (37 C) (Axillary)   Resp 16   Ht 5\' 11"  (1.803 m)   Wt 89.6 kg   SpO2 99%   BMI 27.55 kg/m   FHR: 140s bpm, variability: moderate,  accelerations:  Present,  decelerations:  Absent UC:   regular, every 2-4 minutes SVE:   Dilation: 8 Effacement (%): 90 Station: -1 Exam by:: Mary Martinique Johnson, RN   Assessment / Plan: Induction of labor due to low AFI,  progressing well on pitocin  Labor: active Fetal Wellbeing:  Category I Pain Control: epidural I/D:  GBS neg Anticipated MOD:  NSVD  Elveria Royals 07/28/2018

## 2018-07-28 NOTE — Anesthesia Postprocedure Evaluation (Signed)
Anesthesia Post Note  Patient: Sydney Cannon  Procedure(s) Performed: AN AD [place]     Patient location during evaluation: Mother Baby Anesthesia Type: Epidural Level of consciousness: awake and alert Pain management: pain level controlled Vital Signs Assessment: post-procedure vital signs reviewed and stable Respiratory status: spontaneous breathing, nonlabored ventilation and respiratory function stable Cardiovascular status: stable Postop Assessment: no headache, no backache and epidural receding Anesthetic complications: no    Last Vitals:  Vitals:   07/28/18 0430 07/28/18 0527  BP: 116/74 112/76  Pulse: 90 75  Resp: 20 20  Temp: 36.8 C 36.6 C  SpO2: 100% 96%    Last Pain:  Vitals:   07/28/18 0430  TempSrc: Oral  PainSc: 0-No pain   Pain Goal:                 Rayvon Char

## 2018-07-28 NOTE — Lactation Note (Signed)
This note was copied from a baby's chart. Lactation Consultation Note  Patient Name: Sydney Cannon [id number] Date: 07/28/2018 Reason for consult: Initial assessment;Term  P2 mother whose infant is now 63 hours old.  Mother breast fed her first child for 9 months (First child is now 64 1/33 years old)  Baby was sleeping when I arrived.  Mother had no questions/concerns related to breast feeding.  Her breasts are soft and non tender and nipples are everted and intact.  Encouraged mother to feed 8-12 times in 24 hours or sooner if baby shows feeding cues.  Reviewed cues.  Mother is familiar with hand expression.  Colostrum container provided for any EBM she obtains with hand expression.  Demonstrated finger feeding.    Mother will return to work after 12 weeks.  She has a DEBP for home use.  Father present.  Mom made aware of O/P services, breastfeeding support groups, community resources, and our phone # for post-discharge questions. She will call for latch assistance as needed.   Maternal Data Formula Feeding for Exclusion: No Has patient been taught Hand Expression?: Yes Does the patient have breastfeeding experience prior to this delivery?: Yes  Feeding    LATCH Score                   Interventions    Lactation Tools Discussed/Used WIC Program: No   Consult Status Consult Status: Follow-up Date: 07/29/18 Follow-up type: In-patient    Little Ishikawa 07/28/2018, 12:53 PM

## 2018-07-29 MED ORDER — IBUPROFEN 600 MG PO TABS: 600.0000 mg | ORAL_TABLET | Freq: Four times a day (QID) | ORAL | 0 refills | Status: AC

## 2018-07-29 MED ORDER — MAGNESIUM OXIDE -MG SUPPLEMENT 400 (240 MG) MG PO TABS: 1.0000 | ORAL_TABLET | Freq: Every day | ORAL | 0 refills | Status: AC

## 2018-07-29 MED ORDER — FERROUS SULFATE 325 (65 FE) MG PO TABS: 325.0000 mg | ORAL_TABLET | Freq: Every day | ORAL | 0 refills | Status: AC

## 2018-07-29 NOTE — Discharge Summary (Signed)
Obstetric Discharge Summary Reason for Admission: induction of labor - IVF and oligiohydramnios Prenatal Procedures: NST and ultrasound Intrapartum Procedures: VAVD with midline episiotomy and repair Postpartum Procedures: none Complications-Operative and Postpartum: repair of episiotomy and PP anemia Hemoglobin  Date Value Ref Range Status  07/28/2018 9.4 (L) 12.0 - 15.0 g/dL Final  02/13/2018 11.6 11.6 - 15.9 g/dL Final   HGB  Date Value Ref Range Status  02/23/2017 12.7 11.6 - 15.9 g/dL Final   HCT  Date Value Ref Range Status  07/28/2018 29.7 (L) 36.0 - 46.0 % Final  02/23/2017 39.1 34.8 - 46.6 % Final    Physical Exam:  General: alert, cooperative and no distress Lochia: appropriate Uterine Fundus: firm Incision: healing well DVT Evaluation: No evidence of DVT seen on physical exam.  Discharge Diagnoses: Term Pregnancy-delivered  Discharge Information: Date: 07/29/2018 Activity: pelvic rest Diet: routine Medications: PNV, Ibuprofen, Iron and Mag OX Condition: stable Instructions: refer to practice specific booklet Discharge to: home Follow-up Information    Sydney Fallen, MD. Schedule an appointment as soon as possible for a visit in 6 week(s).   Specialty:  Obstetrics and Gynecology Contact information: [address] [place] [address] [phone]           Newborn Data: Live born female  Birth Weight: 6 lb 9.5 oz (2991 g) APGAR: 7, 9  Newborn Delivery   Birth date/time:  07/28/2018 01:43:00 Delivery type:  Vaginal, Vacuum (Extractor)     Home with mother.  Sydney Cannon 07/29/2018, 9:35 AM

## 2018-07-29 NOTE — Progress Notes (Signed)
PPD 1 VAVD with Medline episiotomy with repair  S:  Reports feeling well - wants to go home             Tolerating po/ No nausea or vomiting             Bleeding is light             Pain controlled with motrin             Up ad lib / ambulatory / voiding QS  Newborn Breast   O:     VS: BP 107/74 (BP Location: Right Arm)   Pulse 67   Temp (!) 97.4 F (36.3 C) (Oral)   Resp 18   Ht 5\' 11"  (1.803 m)   Wt 89.6 kg   SpO2 100%   Breastfeeding Unknown   BMI 27.55 kg/m   LABS:             Recent Labs    07/27/18 0834 07/28/18 0620  WBC 10.9* 20.2*  HGB 10.7* 9.4*  PLT 273 270               Blood type: --/--/O POS, O POS Performed at [facility], [address] [place], [place] [address]  [phone])  Rubella: Immune (06/18 0000)                           Physical Exam:             Alert and oriented X3  Abdomen: soft, non-tender, non-distended              Fundus: firm, non-tender, U-1  Perineum: mild edema - ice pack in place  Lochia: light  Extremities: no edema, no calf pain or tenderness    A: PPD # 1   Doing well - stable status  P: Routine post partum orders  DC home  [place], MSN, [facility] 07/29/2018, 9:30 AM

## 2018-08-01 ENCOUNTER — Inpatient Hospital Stay (HOSPITAL_COMMUNITY): Admit: 2018-08-01 | Payer: Self-pay

## 2018-08-21 DIAGNOSIS — J310 Chronic rhinitis: Secondary | ICD-10-CM | POA: Diagnosis not present

## 2018-08-21 DIAGNOSIS — J0101 Acute recurrent maxillary sinusitis: Secondary | ICD-10-CM | POA: Diagnosis not present

## 2018-08-21 DIAGNOSIS — J342 Deviated nasal septum: Secondary | ICD-10-CM | POA: Diagnosis not present

## 2018-08-29 ENCOUNTER — Other Ambulatory Visit: Payer: Self-pay | Admitting: Otolaryngology

## 2018-08-29 DIAGNOSIS — J329 Chronic sinusitis, unspecified: Secondary | ICD-10-CM

## 2018-09-03 ENCOUNTER — Ambulatory Visit
Admission: RE | Admit: 2018-09-03 | Discharge: 2018-09-03 | Disposition: A | Discharge status: Home / Self Care | Payer: Commercial Managed Care - PPO | Source: Ambulatory Visit | Attending: Otolaryngology | Admitting: Otolaryngology

## 2018-09-03 DIAGNOSIS — J320 Chronic maxillary sinusitis: Secondary | ICD-10-CM | POA: Diagnosis not present

## 2018-09-03 DIAGNOSIS — J329 Chronic sinusitis, unspecified: Secondary | ICD-10-CM

## 2018-09-09 DIAGNOSIS — Z30430 Encounter for insertion of intrauterine contraceptive device: Secondary | ICD-10-CM | POA: Diagnosis not present

## 2018-09-17 DIAGNOSIS — J342 Deviated nasal septum: Secondary | ICD-10-CM | POA: Diagnosis not present

## 2018-09-17 DIAGNOSIS — J320 Chronic maxillary sinusitis: Secondary | ICD-10-CM | POA: Diagnosis not present

## 2018-09-17 DIAGNOSIS — J310 Chronic rhinitis: Secondary | ICD-10-CM | POA: Diagnosis not present

## 2018-10-07 DIAGNOSIS — J3089 Other allergic rhinitis: Secondary | ICD-10-CM | POA: Diagnosis not present

## 2018-10-07 DIAGNOSIS — J4520 Mild intermittent asthma, uncomplicated: Secondary | ICD-10-CM | POA: Diagnosis not present

## 2018-10-07 DIAGNOSIS — J301 Allergic rhinitis due to pollen: Secondary | ICD-10-CM | POA: Diagnosis not present

## 2018-10-09 DIAGNOSIS — Z30431 Encounter for routine checking of intrauterine contraceptive device: Secondary | ICD-10-CM | POA: Diagnosis not present

## 2018-10-09 DIAGNOSIS — N8110 Cystocele, unspecified: Secondary | ICD-10-CM | POA: Diagnosis not present

## 2018-11-21 DIAGNOSIS — J302 Other seasonal allergic rhinitis: Secondary | ICD-10-CM | POA: Diagnosis not present

## 2018-11-21 DIAGNOSIS — Z8571 Personal history of Hodgkin lymphoma: Secondary | ICD-10-CM | POA: Diagnosis not present

## 2018-11-21 DIAGNOSIS — Z0000 Encounter for general adult medical examination without abnormal findings: Secondary | ICD-10-CM | POA: Diagnosis not present

## 2018-12-12 DIAGNOSIS — Z3483 Encounter for supervision of other normal pregnancy, third trimester: Secondary | ICD-10-CM | POA: Diagnosis not present

## 2018-12-12 DIAGNOSIS — Z3482 Encounter for supervision of other normal pregnancy, second trimester: Secondary | ICD-10-CM | POA: Diagnosis not present

## 2019-02-14 ENCOUNTER — Ambulatory Visit: Payer: Commercial Managed Care - PPO | Admitting: Hematology

## 2019-02-14 ENCOUNTER — Other Ambulatory Visit: Payer: Commercial Managed Care - PPO

## 2019-03-07 ENCOUNTER — Telehealth: Payer: Self-pay | Admitting: Hematology

## 2019-03-07 NOTE — Telephone Encounter (Signed)
Confirmed with patient new appointment for 9/9. Rescheduled from 8/17 per patient.

## 2019-03-10 ENCOUNTER — Inpatient Hospital Stay: Payer: Commercial Managed Care - PPO | Admitting: Hematology

## 2019-03-10 ENCOUNTER — Inpatient Hospital Stay: Payer: Commercial Managed Care - PPO

## 2019-04-01 ENCOUNTER — Other Ambulatory Visit: Payer: Self-pay | Admitting: *Deleted

## 2019-04-01 DIAGNOSIS — C8110 Nodular sclerosis classical Hodgkin lymphoma, unspecified site: Secondary | ICD-10-CM

## 2019-04-02 ENCOUNTER — Inpatient Hospital Stay: Payer: Commercial Managed Care - PPO | Attending: Internal Medicine

## 2019-04-02 ENCOUNTER — Inpatient Hospital Stay: Payer: Commercial Managed Care - PPO | Admitting: Hematology

## 2019-04-10 ENCOUNTER — Telehealth: Payer: Self-pay | Admitting: Hematology

## 2019-04-10 NOTE — Telephone Encounter (Signed)
Returned patient's phone call regarding rescheduling cancelled 09/09 appointment, per patient's request appointment has been moved to 10/05.

## 2019-04-28 ENCOUNTER — Inpatient Hospital Stay: Payer: Commercial Managed Care - PPO | Attending: Hematology

## 2019-04-28 ENCOUNTER — Inpatient Hospital Stay (HOSPITAL_BASED_OUTPATIENT_CLINIC_OR_DEPARTMENT_OTHER): Payer: Commercial Managed Care - PPO | Admitting: Hematology

## 2019-04-28 ENCOUNTER — Other Ambulatory Visit: Payer: Self-pay

## 2019-04-28 VITALS — BP 107/74 | HR 69 | Temp 98.3°F | Resp 17 | Ht 71.0 in | Wt 162.5 lb

## 2019-04-28 DIAGNOSIS — Z803 Family history of malignant neoplasm of breast: Secondary | ICD-10-CM | POA: Insufficient documentation

## 2019-04-28 DIAGNOSIS — Z791 Long term (current) use of non-steroidal anti-inflammatories (NSAID): Secondary | ICD-10-CM | POA: Diagnosis not present

## 2019-04-28 DIAGNOSIS — I341 Nonrheumatic mitral (valve) prolapse: Secondary | ICD-10-CM | POA: Insufficient documentation

## 2019-04-28 DIAGNOSIS — Z8711 Personal history of peptic ulcer disease: Secondary | ICD-10-CM | POA: Insufficient documentation

## 2019-04-28 DIAGNOSIS — Z79899 Other long term (current) drug therapy: Secondary | ICD-10-CM | POA: Insufficient documentation

## 2019-04-28 DIAGNOSIS — Z8571 Personal history of Hodgkin lymphoma: Secondary | ICD-10-CM | POA: Diagnosis present

## 2019-04-28 DIAGNOSIS — C8110 Nodular sclerosis classical Hodgkin lymphoma, unspecified site: Secondary | ICD-10-CM

## 2019-04-28 DIAGNOSIS — J45990 Exercise induced bronchospasm: Secondary | ICD-10-CM | POA: Insufficient documentation

## 2019-04-28 LAB — CBC WITH DIFFERENTIAL (CANCER CENTER ONLY)
Abs Immature Granulocytes: 0.01 10*3/uL (ref 0.00–0.07)
Basophils Absolute: 0.1 10*3/uL (ref 0.0–0.1)
Basophils Relative: 1 %
Eosinophils Absolute: 0.1 10*3/uL (ref 0.0–0.5)
Eosinophils Relative: 2 %
HCT: 39.1 % (ref 36.0–46.0)
Hemoglobin: 12.3 g/dL (ref 12.0–15.0)
Immature Granulocytes: 0 %
Lymphocytes Relative: 36 %
Lymphs Abs: 2.1 10*3/uL (ref 0.7–4.0)
MCH: 26.9 pg (ref 26.0–34.0)
MCHC: 31.5 g/dL (ref 30.0–36.0)
MCV: 85.4 fL (ref 80.0–100.0)
Monocytes Absolute: 0.6 10*3/uL (ref 0.1–1.0)
Monocytes Relative: 10 %
Neutro Abs: 3 10*3/uL (ref 1.7–7.7)
Neutrophils Relative %: 51 %
Platelet Count: 260 10*3/uL (ref 150–400)
RBC: 4.58 MIL/uL (ref 3.87–5.11)
RDW: 12.9 % (ref 11.5–15.5)
WBC Count: 5.8 10*3/uL (ref 4.0–10.5)
nRBC: 0 % (ref 0.0–0.2)

## 2019-04-28 LAB — CMP (CANCER CENTER ONLY)
ALT: 12 U/L (ref 0–44)
AST: 16 U/L (ref 15–41)
Albumin: 4.4 g/dL (ref 3.5–5.0)
Alkaline Phosphatase: 36 U/L — ABNORMAL LOW (ref 38–126)
Anion gap: 9 (ref 5–15)
BUN: 12 mg/dL (ref 6–20)
CO2: 25 mmol/L (ref 22–32)
Calcium: 9.2 mg/dL (ref 8.9–10.3)
Chloride: 108 mmol/L (ref 98–111)
Creatinine: 0.92 mg/dL (ref 0.44–1.00)
GFR, Est AFR Am: >60 mL/min (ref >60)
GFR, Estimated: >60 mL/min (ref >60)
Glucose, Bld: 98 mg/dL (ref 70–99)
Potassium: 3.9 mmol/L (ref 3.5–5.1)
Sodium: 142 mmol/L (ref 135–145)
Total Bilirubin: 1 mg/dL (ref 0.3–1.2)
Total Protein: 6.9 g/dL (ref 6.5–8.1)

## 2019-04-28 LAB — SEDIMENTATION RATE: Sed Rate: 4 mm/hr (ref 0–22)

## 2019-04-28 NOTE — Progress Notes (Signed)
Marland Kitchen    HEMATOLOGY/ONCOLOGY CLINIC NOTE  Date of Service: 04/28/2019  PCP: Starlyn Skeans PA-C  CHIEF COMPLAINTS/PURPOSE OF CONSULTATION:  Transfer of care for Hodgkins Lymphoma  HISTORY OF PRESENTING ILLNESS:  Sydney Cannon is a wonderful 33 y.o. female who has been referred to [place] by Starlyn Skeans PA-C for evaluation and continued management of Hodgkins Lymphoma.  Patient was previously treated for her Hodgkin's lymphoma at [facility] in [place]. 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 She had presented with neck lymphadenopathy with no constitutional symptoms. PET/CT scan done on 05/01/2008 as per report showed bulky lymphadenopathy within the bilateral inferior neck and supraclavicular areas and an abnormality along the liver which was noted to be small. She was staged as stage IIIA. She completed treatment with ABVD 6 cycles from 05/20/2008 through 10/19/2008. Patient did not receive any radiation. Patient was noted to have complete remission. She has remained in complete remission since March 2010 for more than 8 years.  Currently the patient notes no significant new lymphadenopathy. No fevers no chills no night sweats. No new fatigue. No chest pain or shortness of breath. No abdominal pain or distention. No residual toxicities from her prior chemotherapy.   Patient did conceive 2 years ago and has a 83-year-old child.   Interval History:  Sydney Cannon returns today for management and evaluation of her Classical Hodgkin's Lymphoma. The patient's last visit with [place] was on 02/13/2018. The pt reports that she is doing well overall.  The pt reports no new concerns in the interim. She feels some "twinges" in her lower abdomen from time to time, which she thinks could be ovarian cysts, her OBGYN has told her to watch it. Pt has not gotten her period since her last pregnancy but currently has a  Mirena IUD. Pt is up to date with her flu shots. She is currently working part time. Pt will begin getting mammograms with her OBGYN next year. Pt's last ECHO was 4 years ago when she was pregnant with her son. There were no concerns, it was a preventative test due to her history.   Lab results today (04/28/19) of CBC w/diff and CMP is as follows: all values are WNL except for Alkaline Phosphatase at 36. 04/28/2019 Sed Rate at 4  On review of systems, pt reports abdominal "twinges" and denies fevers, chills, night sweats, unexpected weight loss, abdominal pain, SOB, changes in breathing and any other symptoms.   MEDICAL HISTORY:  Past Medical History:  Diagnosis Date  . Allergy    Seasonal allergies  . Asthma    mild   . Hodgkin's lymphoma ([place])    2009  History of right breast mass unchanged in the past several years patient notes this was noted to be fibrocystic change. Multiple allergies including decreased grasses and cats was previously on desensitization therapy. Mitral valve prolapse Chronic low back pain History of stomach ulcer in 1997 Exercise-induced asthma   SURGICAL HISTORY: Past Surgical History:  Procedure Laterality Date  . biopsy Right 2009   lymphnode on neck  . PORT-A-CATH REMOVAL Left 2010  . PORTA CATH INSERTION Left 2009  . WISDOM TOOTH EXTRACTION  2006    SOCIAL HISTORY: Social History   Socioeconomic History  . Marital status: Married    Spouse name: Not on file  . Number of children: Not on file  . Years of education: Not on file  . Highest education level: Not on file  Occupational History  . Not on file  Social Needs  . Financial resource strain: Not on file  . Food insecurity    Worry: Not on file    Inability: Not on file  . Transportation needs    Medical: Not on file    Non-medical: Not on file  Tobacco Use  . Smoking status: Never Smoker  . Smokeless tobacco: Never Used  Substance and Sexual Activity  . Alcohol use: Yes     Comment: Occasional  . Drug use: No  . Sexual activity: Yes    Birth control/protection: None    Comment: [redacted] weeks pregnant  Lifestyle  . Physical activity    Days per week: Not on file    Minutes per session: Not on file  . Stress: Not on file  Relationships  . Social Herbalist on phone: Not on file    Gets together: Not on file    Attends religious service: Not on file    Active member of club or organization: Not on file    Attends meetings of clubs or organizations: Not on file    Relationship status: Not on file  . Intimate partner violence    Fear of current or ex partner: Not on file    Emotionally abused: Not on file    Physically abused: Not on file    Forced sexual activity: Not on file  Other Topics Concern  . Not on file  Social History Narrative  . Not on file  Patient currently works as a Marine scientist with [employer] As one 8-year-old child and is currently on Femara and progesterone to try to get pregnant again.   FAMILY HISTORY: Family History  Problem Relation Age of Onset  . Fibromyalgia Mother   . Cancer Maternal Aunt        Breast - cause of death  . Cancer Maternal Grandfather        Lingual   . Stroke Paternal Grandfather   Maternal aunt had breast cancer in her early 48s Maternal grandfather tongue cancer. Was a smoker.  ALLERGIES:  has No Known Allergies.  MEDICATIONS:  Current Outpatient Medications  Medication Sig Dispense Refill  . albuterol (PROVENTIL HFA;VENTOLIN HFA) 108 (90 Base) MCG/ACT inhaler Inhale 2 puffs into the lungs every 6 (six) hours as needed for wheezing or shortness of breath.    . ferrous sulfate 325 (65 FE) MG tablet Take 1 tablet (325 mg total) by mouth daily with breakfast. 30 tablet 0  . ibuprofen (ADVIL,MOTRIN) 600 MG tablet Take 1 tablet (600 mg total) by mouth every 6 (six) hours. 30 tablet 0  . levocetirizine (XYZAL) 5 MG tablet Take 5 mg by mouth every evening.     . Magnesium Oxide 400 (240 Mg) MG  TABS Take 1 tablet (400 mg total) by mouth daily. Take with iron daily to prevent constipation 30 tablet 0  . montelukast (SINGULAIR) 10 MG tablet Take 10 mg by mouth at bedtime.     . Prenatal Vit-Fe Fumarate-FA (PRENATAL MULTIVITAMIN) TABS tablet Take 1 tablet by mouth at bedtime.     . Probiotic Product (PROBIOTIC PO) Take 1 capsule by mouth at bedtime.     No current facility-administered medications for this visit.     REVIEW OF SYSTEMS:    A 10+ POINT REVIEW OF SYSTEMS WAS OBTAINED including neurology, dermatology, psychiatry, cardiac, respiratory, lymph, extremities, GI, GU, Musculoskeletal, constitutional, breasts, reproductive, HEENT.  All pertinent positives are noted  in the HPI.  All others are negative.   PHYSICAL EXAMINATION: ECOG PERFORMANCE STATUS: 0 - Asymptomatic  . Vitals:   04/28/19 1411  BP: 107/74  Pulse: 69  Resp: 17  Temp: 98.3 F (36.8 C)  SpO2: 100%   Filed Weights   04/28/19 1411  Weight: 162 lb 8 oz (73.7 kg)   .Body mass index is 22.66 kg/m.  GENERAL:alert, in no acute distress and comfortable SKIN: no acute rashes, no significant lesions EYES: conjunctiva are pink and non-injected, sclera anicteric OROPHARYNX: MMM, no exudates, no oropharyngeal erythema or ulceration NECK: supple, no JVD LYMPH:  no palpable lymphadenopathy in the cervical, axillary or inguinal regions LUNGS: clear to auscultation b/l with normal respiratory effort HEART: regular rate & rhythm ABDOMEN:  normoactive bowel sounds , non tender, not distended. No palpable hepatosplenomegaly.  Extremity: no pedal edema PSYCH: alert & oriented x 3 with fluent speech NEURO: no focal motor/sensory deficits  LABORATORY DATA:  I have reviewed the data as listed  . CBC Latest Ref Rng & Units 04/28/2019 07/28/2018 07/27/2018  WBC 4.0 - 10.5 K/uL 5.8 20.2(H) 10.9(H)  Hemoglobin 12.0 - 15.0 g/dL 12.3 9.4(L) 10.7(L)  Hematocrit 36.0 - 46.0 % 39.1 29.7(L) 34.2(L)  Platelets 150 - 400 K/uL  260 270 273    . CMP Latest Ref Rng & Units 04/28/2019 02/13/2018 02/23/2017  Glucose 70 - 99 mg/dL 98 75 85  BUN 6 - 20 mg/dL 12 9 12.6  Creatinine 0.44 - 1.00 mg/dL 0.92 0.72 0.9  Sodium 135 - 145 mmol/L 142 137 140  Potassium 3.5 - 5.1 mmol/L 3.9 4.1 4.4  Chloride 98 - 111 mmol/L 108 107 -  CO2 22 - 32 mmol/L 25 23 24   Calcium 8.9 - 10.3 mg/dL 9.2 9.1 10.0  Total Protein 6.5 - 8.1 g/dL 6.9 6.6 7.7  Total Bilirubin 0.3 - 1.2 mg/dL 1.0 0.6 0.80  Alkaline Phos 38 - 126 U/L 36(L) 23(L) 18(L)  AST 15 - 41 U/L 16 14(L) 21  ALT 0 - 44 U/L 12 10 19      RADIOGRAPHIC STUDIES: I have personally reviewed the radiological images as listed and agreed with the findings in the report. No results found.  ASSESSMENT & PLAN:   33 y.o. 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 She had presented with neck lymphadenopathy with no constitutional symptoms. PET/CT scan done on 05/01/2008 as per report showed bulky lymphadenopathy within the bilateral inferior neck and supraclavicular areas and an abnormality along the liver which was noted to be small. She was staged as stage IIIA. She completed treatment with ABVD 6 cycles from 05/20/2008 through 10/19/2008. Patient did not receive any radiation. Patient was noted to have complete remission. She has remained in complete remission since March 2010 for more than 8 years.  PLAN:  -Discussed pt labwork today, 04/28/19; all values are WNL except for Alkaline Phosphatase at 36. -Discussed 04/28/2019 Sed Rate at 4 -Advised pt to f/u with PCP about Prevnar/Pneumovax vaccine due to exposure to chemotherapy  -Discussed that relapse chances are relatively low after 10 years  -Advised pt to repeat ECHO every 10 years (6 more years) -Discussed that given her history of Adriamycin,  if she develops palpitations or SOB, will collect an ECHO -Will continue to watch blood counts  -No overt evidence of her residual toxicities -The  pt shows no clinical or lab progression of Hodgkin's lymphoma at this time.  -Will see the pt back in 1 year with labs     #2 family history of early breast cancer in her maternal aunt in her early 22s.  Unclear if any genetic testing was done. No family history of ovarian cancer, colon cancer, melanomas, prostate cancer or other cancers. PLAN:  -I discussed and offered referral to the genetic counselor to discuss possible genetic testing for familial breast cancer. -Patient wants to hold off on genetic testing at this time.  #3 Exercise induced asthma - mx per PCP  FOLLOW UP: RTC with Dr Irene Limbo with labs in 12 months  The total time spent in the appt was 20 minutes and more than 50% was on counseling and direct patient cares.  All of the patient's questions were answered with apparent satisfaction. The patient knows to call the clinic with any problems, questions or concerns.   Sullivan Lone MD [place] AAHIVMS [facility] Hematology/Oncology Physician [facility]  (Office):       [phone] (Work cell):  [phone] (Fax):           [phone]  04/28/2019 5:07 PM  I, Yevette Edwards, am acting as a scribe for Dr. Sullivan Lone.   .I have reviewed the above documentation for accuracy and completeness, and I agree with the above. Brunetta Genera MD

## 2019-04-29 ENCOUNTER — Telehealth: Payer: Self-pay | Admitting: Hematology

## 2019-04-29 NOTE — Telephone Encounter (Signed)
Scheduled appt per 10/5 los.  Sent a staff message and a calendar will be mailed out,

## 2020-04-26 ENCOUNTER — Telehealth: Payer: Self-pay | Admitting: Hematology

## 2020-04-26 NOTE — Telephone Encounter (Signed)
Called patient regarding 10/06 appointment, patient would appointment to be cancelled. Patient will call to reschedule.

## 2020-04-28 ENCOUNTER — Inpatient Hospital Stay: Payer: Self-pay

## 2020-04-28 ENCOUNTER — Inpatient Hospital Stay: Payer: Self-pay | Admitting: Hematology

## 2020-10-21 IMAGING — CT CT MAXILLOFACIAL W/O CM
1 series · 15 of 30 positions shown, 19 images · non-contrast
Comparison: None.

CLINICAL DATA: Chronic sinusitis.

EXAM:
CT MAXILLOFACIAL WITHOUT CONTRAST
TECHNIQUE: Multidetector CT images of the paranasal sinuses were obtained using
the standard protocol without intravenous contrast.

[Series 4: soft tissue · axial · 0.32mm/px · z∈[-139,-18]mm · 15 of 131 slices shown, 19 images]
[im 5/131  brain]
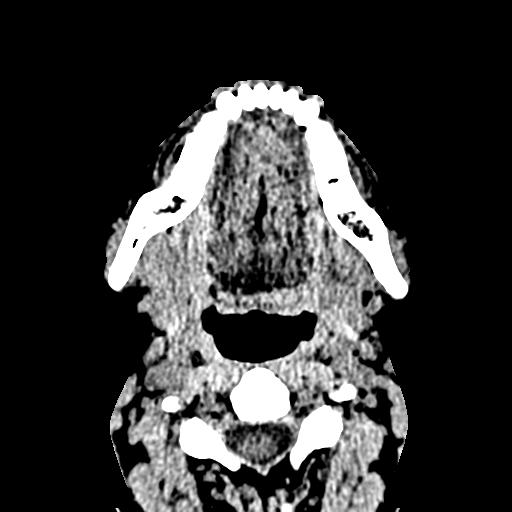
[im 5/131  bone]
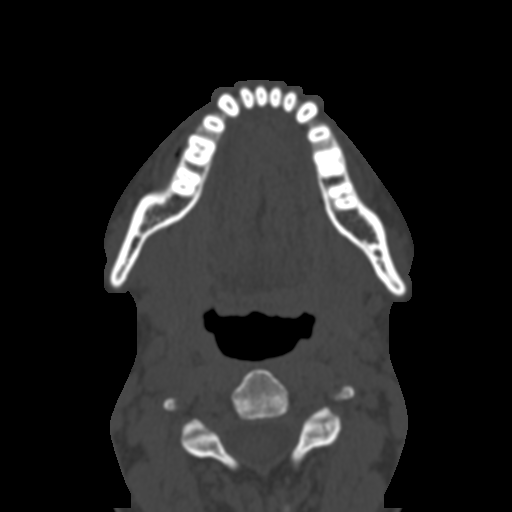
[im 14/131  bone]
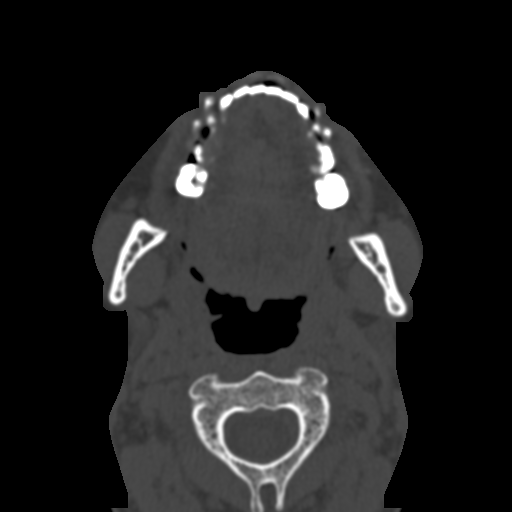
[im 23/131  bone]
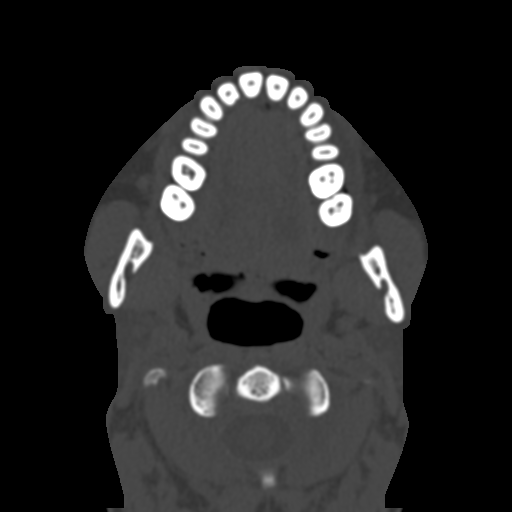
[im 32/131  bone]
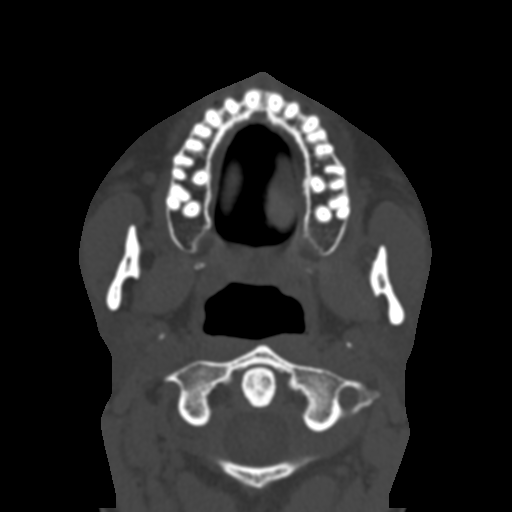
[im 41/131  brain]
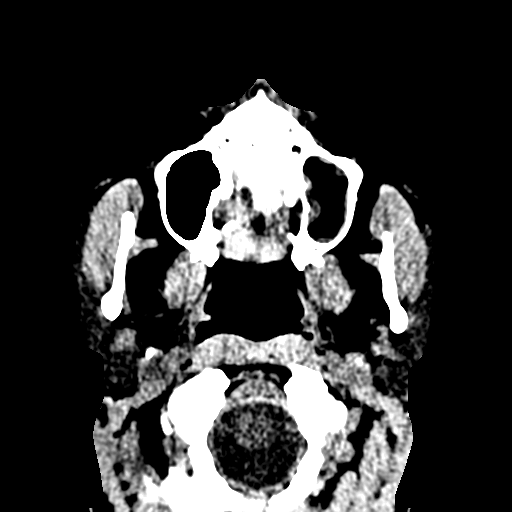
[im 41/131  bone]
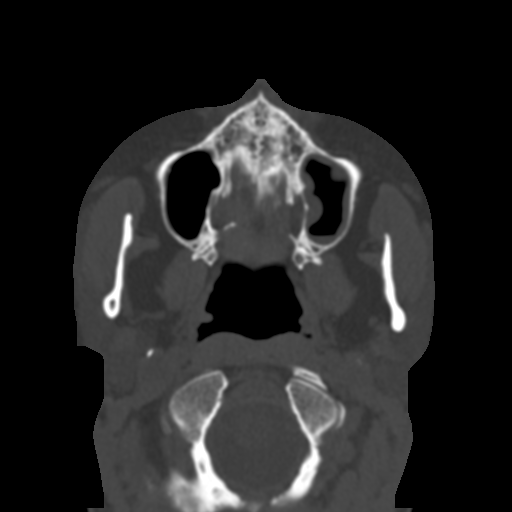
[im 50/131  bone]
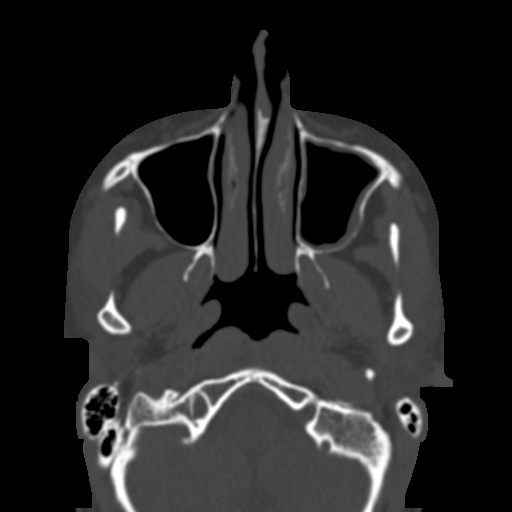
[im 59/131  bone]
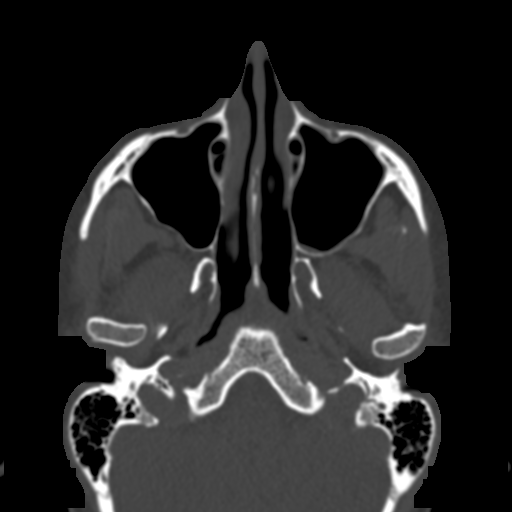
[im 68/131  bone]
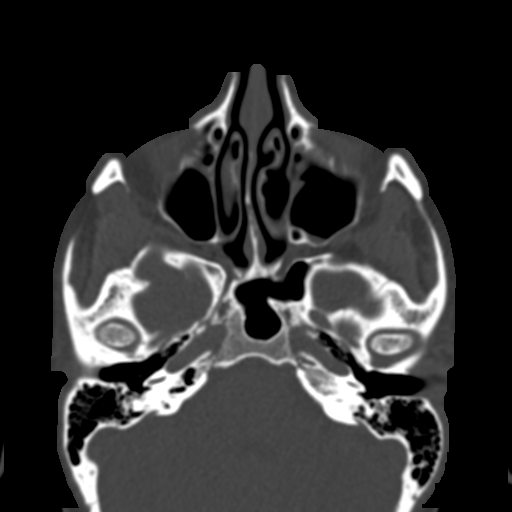
[im 72/131  brain]
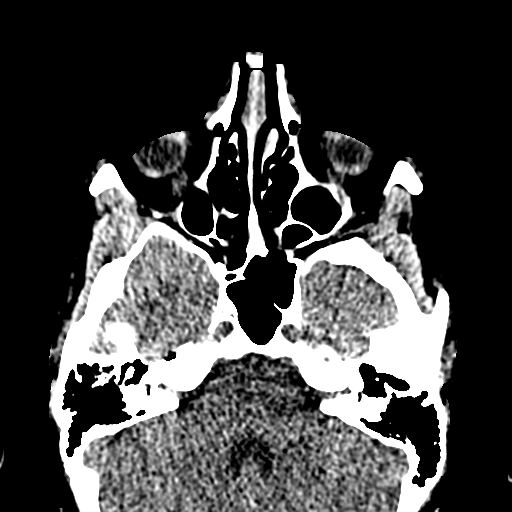
[im 72/131  bone]
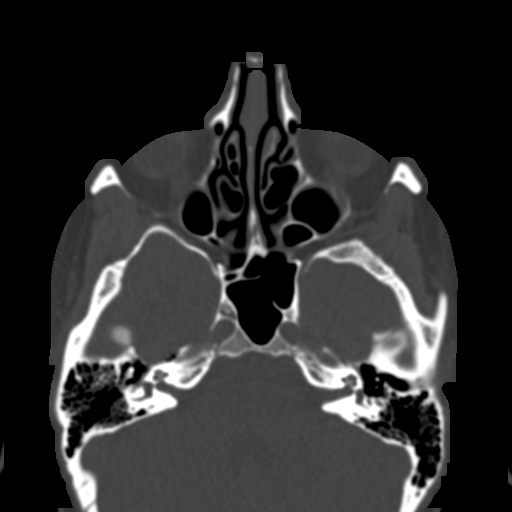
[im 81/131  bone]
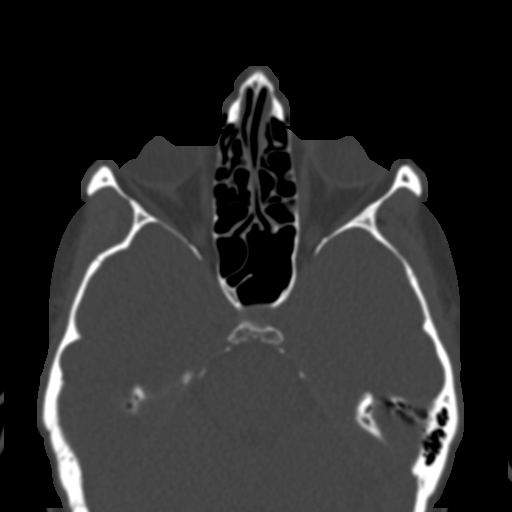
[im 90/131  bone]
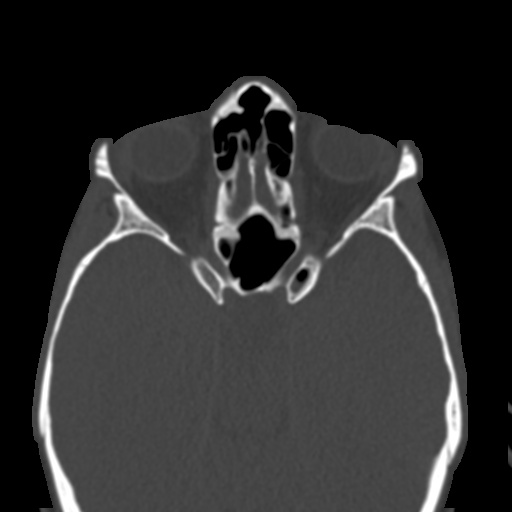
[im 99/131  bone]
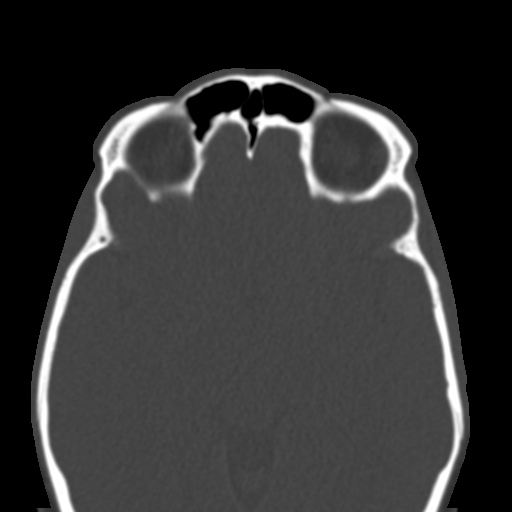
[im 108/131  brain]
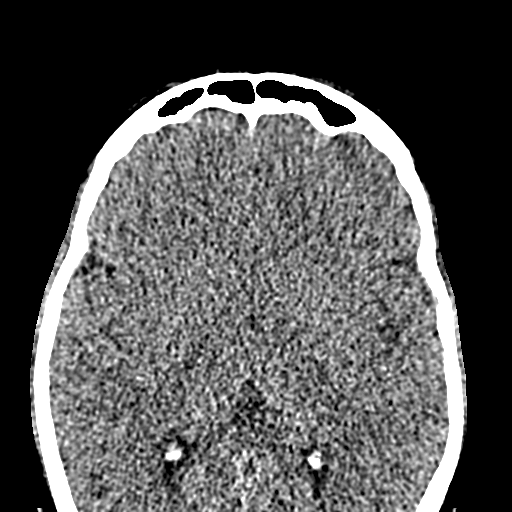
[im 108/131  bone]
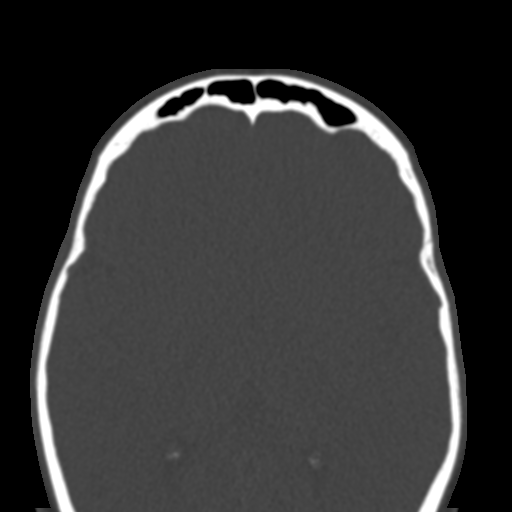
[im 117/131  bone]
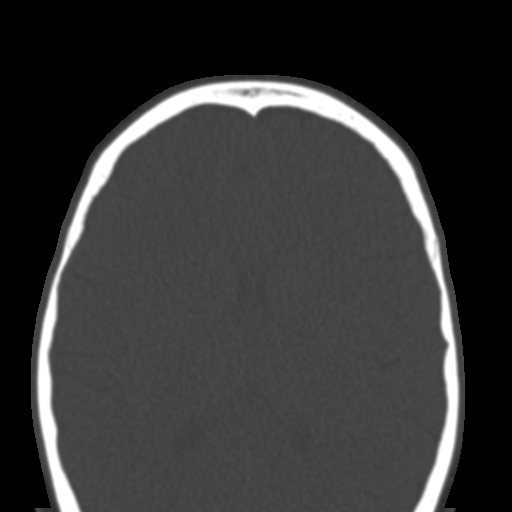
[im 126/131  bone]
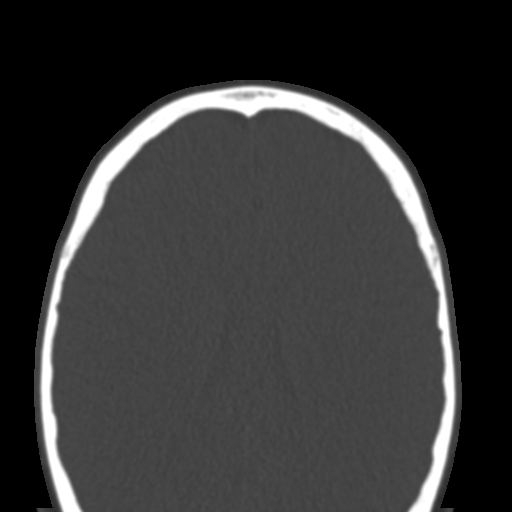

[15 of 30 positions shown; findings below may reference images not displayed]

FINDINGS: Paranasal sinuses:

Frontal: Normally aerated. Patent frontal sinus drainage pathways.

Ethmoid: Minimal left greater than right ethmoid air cell mucosal
thickening.

Maxillary: Mild left greater than right maxillary sinus mucosal
thickening, greatest inferiorly. No fluid.

Sphenoid: Mild mucosal thickening in the aerated left anterior
clinoid process, otherwise clear bilaterally. Patent sphenoethmoidal
recesses.

Right ostiomeatal unit: Patent.

Left ostiomeatal unit: Patent.

Nasal passages: Patent. Right concha lamella. 3 mm rightward nasal
septal deviation superiorly.

Anatomy: No pneumatization superior to anterior ethmoid notches.
Keros I. Sellar sphenoid pneumatization pattern. No dehiscence of
carotid or optic canals. No onodi cell.

Other: Clear mastoid air cells and tympanic cavities. Unremarkable
appearance of the orbits and included portion of the brain.
IMPRESSION: Mild mucosal thickening in the maxillary sinuses.  No fluid.
